# Patient Record
Sex: Male | Born: 1960 | Race: Black or African American | Hispanic: No | Marital: Married | Smoking: Never smoker
Health system: Southern US, Community
[De-identification: ages and names within clinical notes are randomized; demographics above are authoritative.]

## PROBLEM LIST (undated history)

## (undated) ENCOUNTER — Ambulatory Visit (HOSPITAL_COMMUNITY): Payer: No Typology Code available for payment source | Source: Home / Self Care

## (undated) DIAGNOSIS — G8929 Other chronic pain: Secondary | ICD-10-CM

## (undated) DIAGNOSIS — I10 Essential (primary) hypertension: Secondary | ICD-10-CM

## (undated) DIAGNOSIS — J309 Allergic rhinitis, unspecified: Secondary | ICD-10-CM

## (undated) DIAGNOSIS — E7800 Pure hypercholesterolemia, unspecified: Secondary | ICD-10-CM

## (undated) DIAGNOSIS — M545 Other chronic pain: Secondary | ICD-10-CM

## (undated) HISTORY — DX: Allergic rhinitis, unspecified: J30.9

## (undated) HISTORY — DX: Other chronic pain: G89.29

## (undated) HISTORY — DX: Other chronic pain: M54.50

## (undated) HISTORY — DX: Low back pain: M54.5

---

## 1999-12-29 ENCOUNTER — Encounter: Payer: Self-pay | Admitting: Emergency Medicine

## 1999-12-29 ENCOUNTER — Emergency Department (HOSPITAL_COMMUNITY): Admission: EM | Admit: 1999-12-29 | Discharge: 1999-12-29 | Payer: Self-pay | Admitting: Emergency Medicine

## 2000-06-16 ENCOUNTER — Encounter: Payer: Self-pay | Admitting: Occupational Medicine

## 2000-06-16 ENCOUNTER — Encounter: Admission: RE | Admit: 2000-06-16 | Discharge: 2000-06-16 | Payer: Self-pay | Admitting: Occupational Medicine

## 2001-09-03 ENCOUNTER — Ambulatory Visit (HOSPITAL_COMMUNITY): Admission: RE | Admit: 2001-09-03 | Discharge: 2001-09-03 | Payer: Self-pay | Admitting: *Deleted

## 2001-09-03 ENCOUNTER — Encounter: Payer: Self-pay | Admitting: *Deleted

## 2001-10-08 ENCOUNTER — Ambulatory Visit (HOSPITAL_COMMUNITY): Admission: RE | Admit: 2001-10-08 | Discharge: 2001-10-08 | Payer: Self-pay | Admitting: *Deleted

## 2001-10-08 ENCOUNTER — Encounter: Payer: Self-pay | Admitting: *Deleted

## 2002-03-30 ENCOUNTER — Encounter: Payer: Self-pay | Admitting: Emergency Medicine

## 2002-03-30 ENCOUNTER — Emergency Department (HOSPITAL_COMMUNITY): Admission: EM | Admit: 2002-03-30 | Discharge: 2002-03-30 | Payer: Self-pay | Admitting: Emergency Medicine

## 2002-04-07 ENCOUNTER — Emergency Department (HOSPITAL_COMMUNITY): Admission: EM | Admit: 2002-04-07 | Discharge: 2002-04-07 | Payer: Self-pay | Admitting: Emergency Medicine

## 2003-06-17 ENCOUNTER — Emergency Department (HOSPITAL_COMMUNITY): Admission: EM | Admit: 2003-06-17 | Discharge: 2003-06-18 | Payer: Self-pay | Admitting: Emergency Medicine

## 2003-12-22 ENCOUNTER — Emergency Department (HOSPITAL_COMMUNITY): Admission: EM | Admit: 2003-12-22 | Discharge: 2003-12-22 | Payer: Self-pay | Admitting: *Deleted

## 2004-06-11 ENCOUNTER — Ambulatory Visit: Payer: Self-pay | Admitting: Family Medicine

## 2004-12-13 ENCOUNTER — Ambulatory Visit: Payer: Self-pay | Admitting: Family Medicine

## 2005-02-08 ENCOUNTER — Ambulatory Visit: Payer: Self-pay | Admitting: Nurse Practitioner

## 2005-02-09 ENCOUNTER — Ambulatory Visit: Payer: Self-pay | Admitting: *Deleted

## 2005-04-25 ENCOUNTER — Ambulatory Visit: Payer: Self-pay | Admitting: Nurse Practitioner

## 2006-05-26 ENCOUNTER — Other Ambulatory Visit: Admission: RE | Admit: 2006-05-26 | Discharge: 2006-05-26 | Payer: Self-pay | Admitting: Family Medicine

## 2006-05-26 ENCOUNTER — Encounter (INDEPENDENT_AMBULATORY_CARE_PROVIDER_SITE_OTHER): Payer: Self-pay | Admitting: Specialist

## 2006-05-26 ENCOUNTER — Ambulatory Visit: Payer: Self-pay | Admitting: Nurse Practitioner

## 2006-06-20 ENCOUNTER — Ambulatory Visit: Payer: Self-pay | Admitting: Nurse Practitioner

## 2006-06-30 ENCOUNTER — Ambulatory Visit (HOSPITAL_COMMUNITY): Admission: RE | Admit: 2006-06-30 | Discharge: 2006-06-30 | Payer: Self-pay | Admitting: Family Medicine

## 2006-07-14 ENCOUNTER — Ambulatory Visit: Payer: Self-pay | Admitting: Nurse Practitioner

## 2006-08-10 ENCOUNTER — Ambulatory Visit (HOSPITAL_COMMUNITY): Admission: RE | Admit: 2006-08-10 | Discharge: 2006-08-10 | Payer: Self-pay | Admitting: Urology

## 2006-09-15 ENCOUNTER — Ambulatory Visit (HOSPITAL_BASED_OUTPATIENT_CLINIC_OR_DEPARTMENT_OTHER): Admission: RE | Admit: 2006-09-15 | Discharge: 2006-09-15 | Payer: Self-pay | Admitting: Urology

## 2006-09-15 ENCOUNTER — Encounter (INDEPENDENT_AMBULATORY_CARE_PROVIDER_SITE_OTHER): Payer: Self-pay | Admitting: Specialist

## 2006-10-20 ENCOUNTER — Ambulatory Visit: Payer: Self-pay | Admitting: Nurse Practitioner

## 2007-01-31 IMAGING — CT CT ABDOMEN W/O CM
1 series · 16 of 32 positions shown, 20 images · IV contrast (agent unspecified)
Comparison: 09/03/2001.

CLINICAL DATA: Bilateral flank pain and hematuria for one year. History of
nephrolithiasis.

ABDOMEN CT WITHOUT CONTRAST:
TECHNIQUE: Helical transaxial images of the abdomen and pelvis were obtained
without intravenous or oral contrast.

[Series 2: stone_wo 5.0 b40f st · axial · 0.68mm/px · z∈[-481,-121]mm · 16 of 101 slices shown, 20 images]
[im 7/101  soft-tissue]
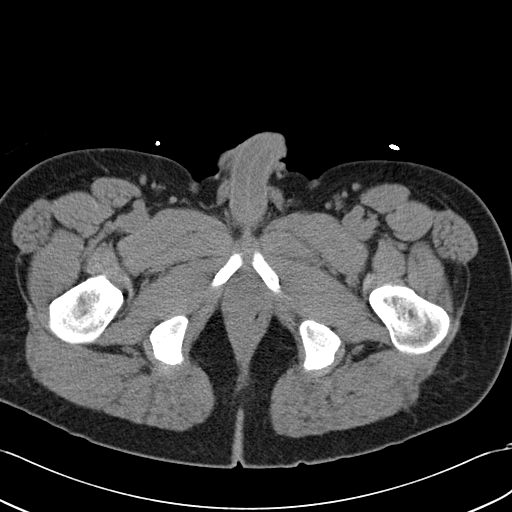
[im 7/101  bone]
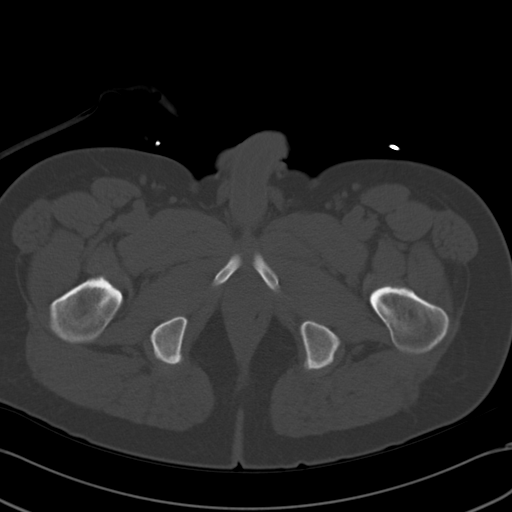
[im 13/101  soft-tissue]
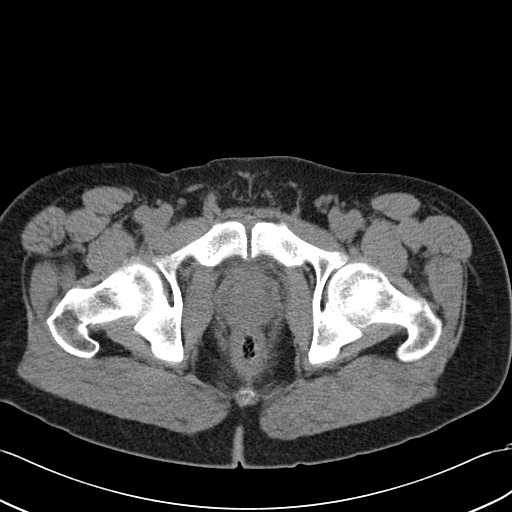
[im 20/101  soft-tissue]
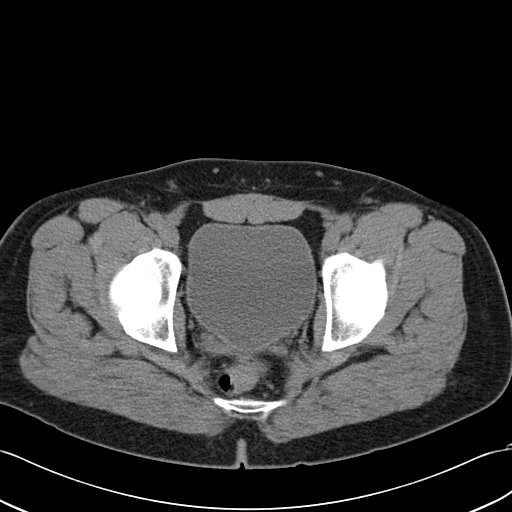
[im 26/101  soft-tissue]
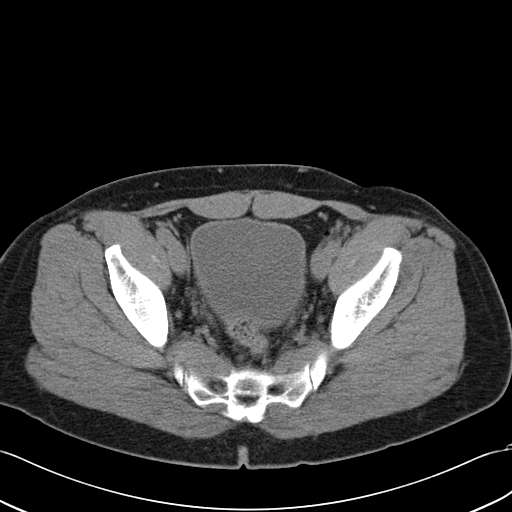
[im 33/101  soft-tissue]
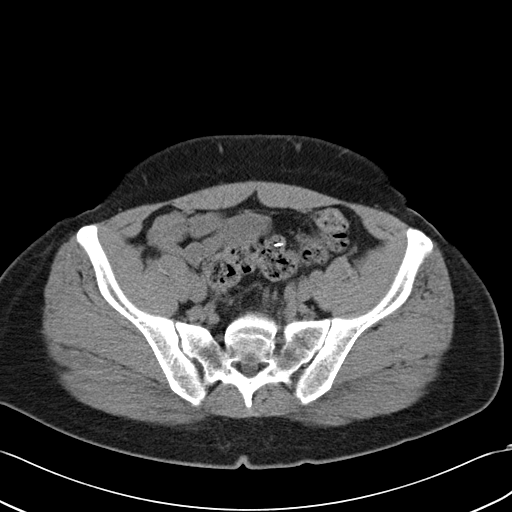
[im 39/101  soft-tissue]
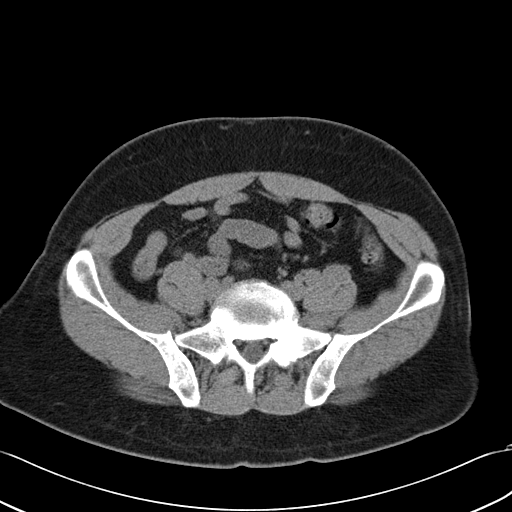
[im 46/101  soft-tissue]
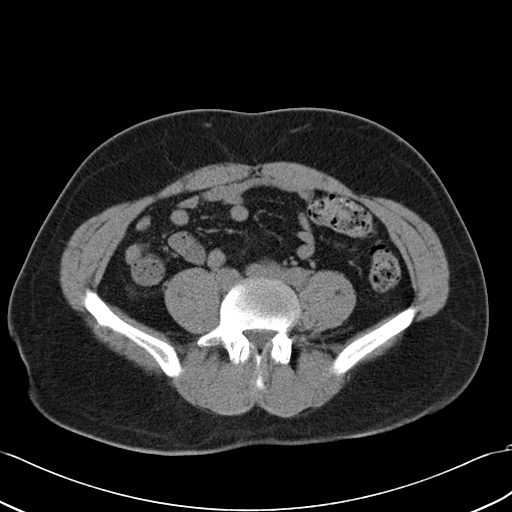
[im 55/101  soft-tissue]
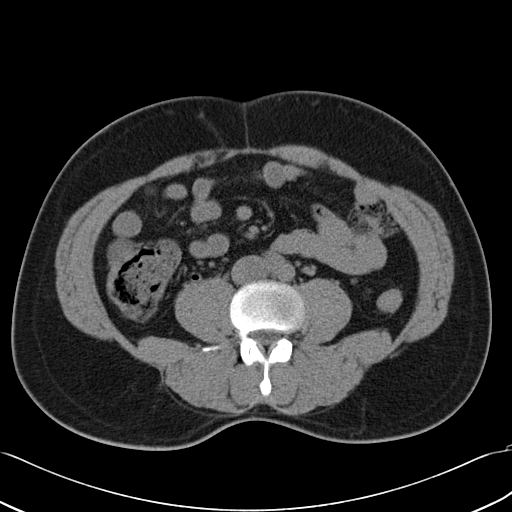
[im 62/101  soft-tissue]
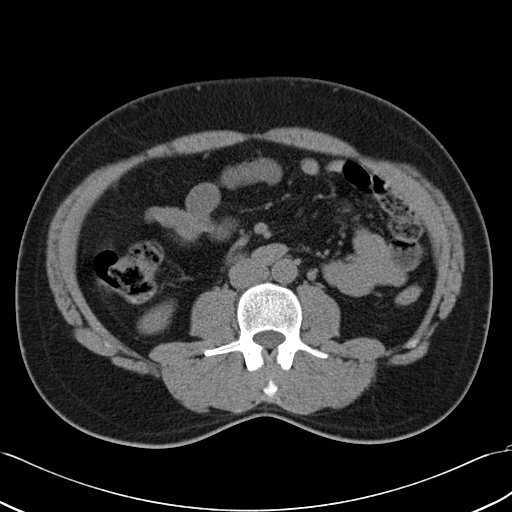
[im 62/101  bone]
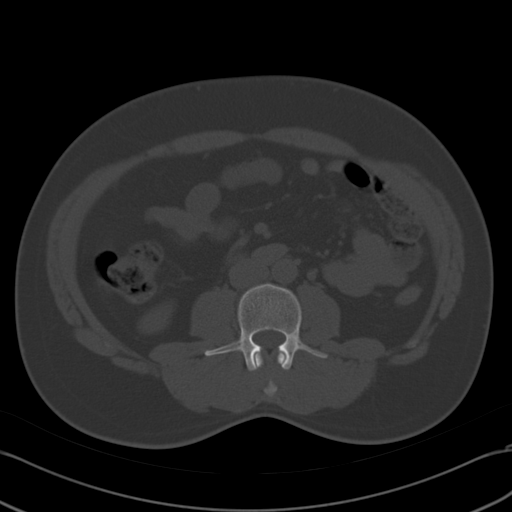
[im 68/101  soft-tissue]
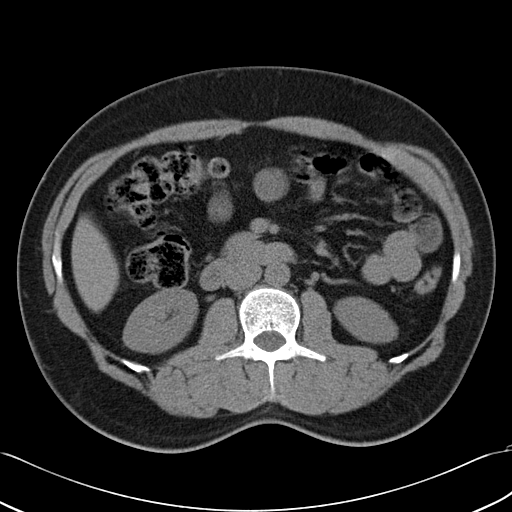
[im 75/101  soft-tissue]
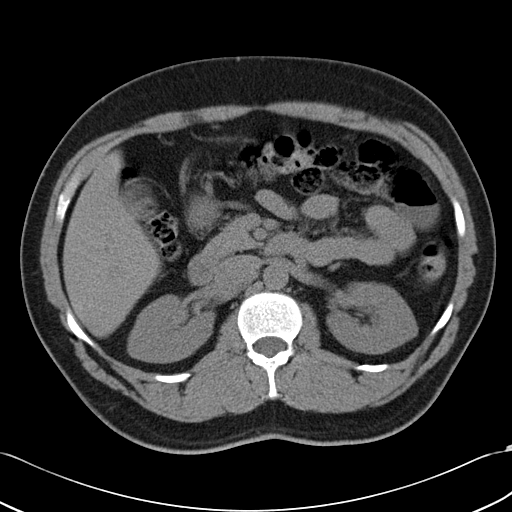
[im 81/101  soft-tissue]
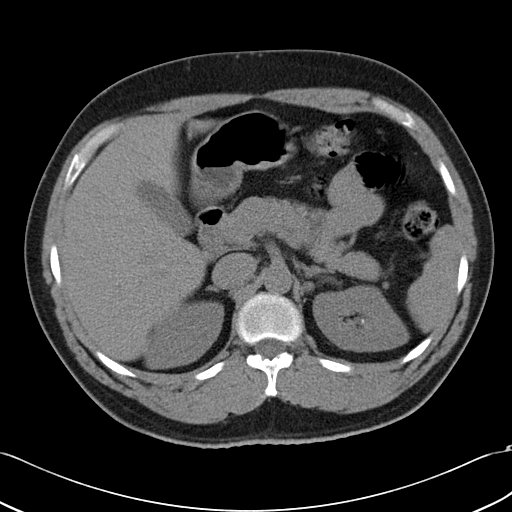
[im 88/101  soft-tissue]
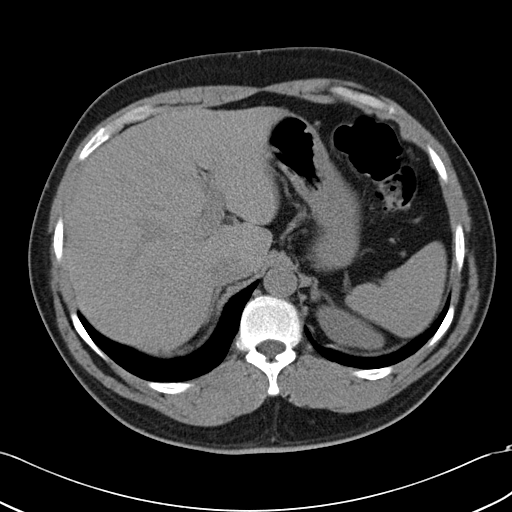
[im 88/101  lung]
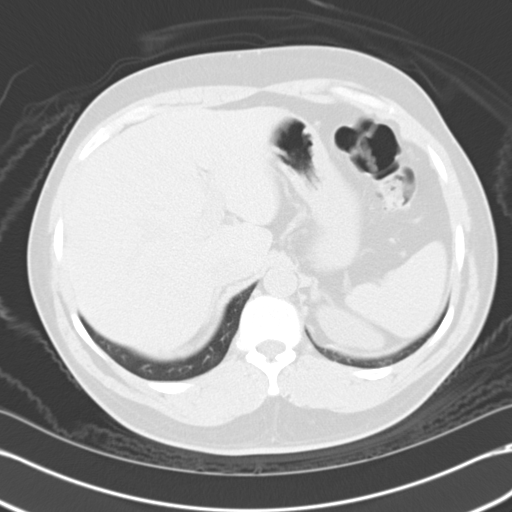
[im 91/101  lung]
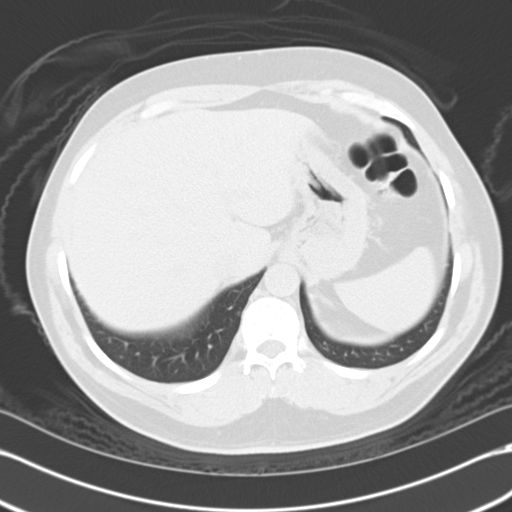
[im 94/101  soft-tissue]
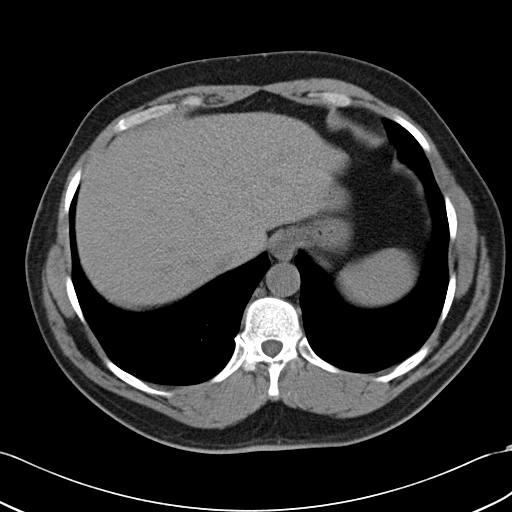
[im 94/101  lung]
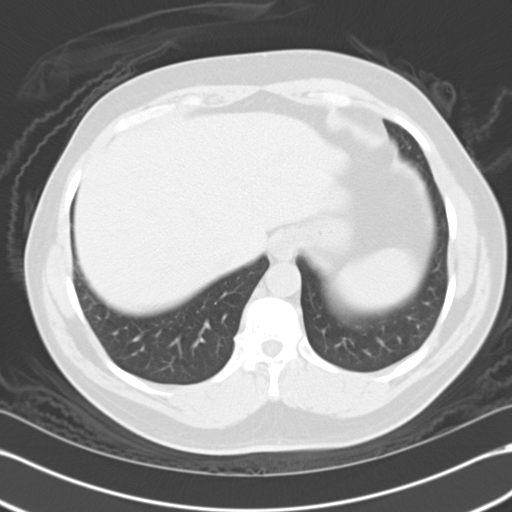
[im 97/101  lung]
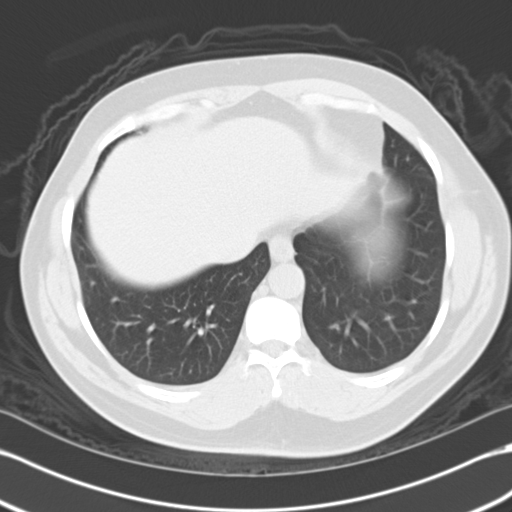

[16 of 32 positions shown; findings below may reference images not displayed]

FINDINGS: Stable 3 mm calculus in the upper pole of the left kidney. The
previously demonstrated small lower pole left renal calculus and tiny upper pole
right renal calculus are no longer seen. No ureteral calculi and no
hydronephrosis. The remainder of the examination is unremarkable.
IMPRESSION: 3 mm nonobstructing left renal calculus.

PELVIS CT WITHOUT CONTRAST:
FINDINGS: Small bilateral pelvic phleboliths. No bladder or ureteral calculi
seen. Small linear calcific density in the stool in the sigmoid colon,
suggesting a small, ingested piece of bone.
IMPRESSION: No significant pelvic abnormality.

## 2007-03-28 ENCOUNTER — Encounter (INDEPENDENT_AMBULATORY_CARE_PROVIDER_SITE_OTHER): Payer: Self-pay | Admitting: *Deleted

## 2007-07-08 ENCOUNTER — Emergency Department (HOSPITAL_COMMUNITY): Admission: EM | Admit: 2007-07-08 | Discharge: 2007-07-08 | Payer: Self-pay | Admitting: Emergency Medicine

## 2009-01-22 ENCOUNTER — Emergency Department (HOSPITAL_COMMUNITY): Admission: EM | Admit: 2009-01-22 | Discharge: 2009-01-22 | Payer: Self-pay | Admitting: Emergency Medicine

## 2009-10-09 ENCOUNTER — Ambulatory Visit (HOSPITAL_BASED_OUTPATIENT_CLINIC_OR_DEPARTMENT_OTHER): Admission: RE | Admit: 2009-10-09 | Discharge: 2009-10-09 | Payer: Self-pay | Admitting: Internal Medicine

## 2009-10-17 ENCOUNTER — Ambulatory Visit: Payer: Self-pay | Admitting: Internal Medicine

## 2010-08-01 ENCOUNTER — Encounter: Payer: Self-pay | Admitting: Family Medicine

## 2010-08-01 ENCOUNTER — Encounter: Payer: Self-pay | Admitting: Neurology

## 2010-10-17 LAB — URINALYSIS, ROUTINE W REFLEX MICROSCOPIC
Bilirubin Urine: NEGATIVE
Ketones, ur: NEGATIVE mg/dL
Nitrite: NEGATIVE
Protein, ur: 30 mg/dL — AB
Specific Gravity, Urine: 1.029 (ref 1.005–1.030)

## 2010-10-17 LAB — URINE MICROSCOPIC-ADD ON

## 2010-11-26 NOTE — Op Note (Signed)
NAME:  Howard Rice, Howard Rice                  ACCOUNT NO.:  [id number]   MEDICAL RECORD NO.:  [id number]          PATIENT TYPE:  AMB   LOCATION:  [facility]                         FACILITY:  [facility]   PHYSICIAN:  Ronald L. Earlene Plater, M.D.  DATE OF BIRTH:  Jul 17, 1960   DATE OF PROCEDURE:  09/15/2006  DATE OF DISCHARGE:                               OPERATIVE REPORT   PREOPERATIVE DIAGNOSIS:  Hematuria, bilateral flank pain, and positive  NMP22.   PROCEDURE:  Cystourethroscopy, bladder and bilateral renal pelvic  washings, and bilateral retrograde pyelogram.   SURGEON:  Dr. Gaynelle Arabian.   ANESTHESIA:  LMA   ESTIMATED BLOOD LOSS:  Negligible.   TUBES:  None.   COMPLICATIONS:  None.   INDICATIONS FOR PROCEDURE:  [name] is a very nice 50 year old  [place] male who presents initially with left flank pain but then  developed subsequent right flank pain and hematuria. He underwent CT  scan which revealed stable punctate stone within the upper pole  collecting system left kidney and a small lung lesion that will be  followed up in the future.  He saw really no other abnormalities.  However, he was found to have a positive NMP22. After understanding  risks, benefits and alternatives elected to proceed with the above  procedure.   DESCRIPTION OF PROCEDURE:  The patient was placed supine position after  proper LMA anesthesia was placed.  The dorsal lithotomy position,  prepped and draped with Betadine in sterile fashion.  Cystourethroscopy  was performed with 22.5 French Olympus panendoscope and was noted to  have mild trilobar hypertrophy.  The bladder was smooth-walled.  Efflux  of clear urine was noted from normally placed ureteral orifices  bilaterally.  Bilateral retrograde pyelograms performed with 6-French  open-ended catheter and both systems appeared to be totally normal and  drained normally.  There were no filling defects. The open-ended  catheter was then placed in the right and left  renal pelvis and  barbotage cytologies were obtained from each renal pelvis and submitted  for cytology.  Barbotage cytologies were then obtained of the bladder  with saline and submitted for cytology.  The bladder drained.  The  panendoscope was removed.  The patient was taken to the recovery room  stable.   RETROGRADE URETERAL PYELOGRAM:  Utilizing 6-French open-ended catheter  bilateral retrograde ureteral pyelograms were performed. The entire  ureter and collecting system appeared to be normal.  There was no  hydronephrosis or filling defects.  The system appeared to drain well.      Ronald L. Earlene Plater, M.D.  Electronically Signed     RLD/MEDQ  D:  09/15/2006  T:  09/15/2006  Job:  [id number]

## 2011-04-15 LAB — URINALYSIS, ROUTINE W REFLEX MICROSCOPIC
Bilirubin Urine: NEGATIVE
Glucose, UA: NEGATIVE
Leukocytes, UA: NEGATIVE
Protein, ur: NEGATIVE
Specific Gravity, Urine: 1.019

## 2011-04-15 LAB — URINE MICROSCOPIC-ADD ON

## 2011-06-13 ENCOUNTER — Other Ambulatory Visit (HOSPITAL_COMMUNITY): Payer: Self-pay | Admitting: Family Medicine

## 2011-06-13 ENCOUNTER — Ambulatory Visit (HOSPITAL_COMMUNITY)
Admission: RE | Admit: 2011-06-13 | Discharge: 2011-06-13 | Disposition: A | Discharge status: Home / Self Care | Payer: Medicaid Other | Source: Ambulatory Visit | Attending: Family Medicine | Admitting: Family Medicine

## 2011-06-13 DIAGNOSIS — M5030 Other cervical disc degeneration, unspecified cervical region: Secondary | ICD-10-CM | POA: Insufficient documentation

## 2011-06-13 DIAGNOSIS — R209 Unspecified disturbances of skin sensation: Secondary | ICD-10-CM | POA: Insufficient documentation

## 2011-06-13 DIAGNOSIS — M542 Cervicalgia: Secondary | ICD-10-CM | POA: Insufficient documentation

## 2011-06-13 DIAGNOSIS — R52 Pain, unspecified: Secondary | ICD-10-CM

## 2011-06-13 DIAGNOSIS — M5380 Other specified dorsopathies, site unspecified: Secondary | ICD-10-CM | POA: Insufficient documentation

## 2011-06-18 ENCOUNTER — Encounter: Payer: Self-pay | Admitting: Emergency Medicine

## 2011-06-18 ENCOUNTER — Emergency Department (HOSPITAL_COMMUNITY)
Admission: EM | Admit: 2011-06-18 | Discharge: 2011-06-18 | Disposition: A | Discharge status: Home / Self Care | Payer: Medicaid Other | Attending: Emergency Medicine | Admitting: Emergency Medicine

## 2011-06-18 DIAGNOSIS — J988 Other specified respiratory disorders: Secondary | ICD-10-CM

## 2011-06-18 DIAGNOSIS — I10 Essential (primary) hypertension: Secondary | ICD-10-CM | POA: Insufficient documentation

## 2011-06-18 DIAGNOSIS — R059 Cough, unspecified: Secondary | ICD-10-CM | POA: Insufficient documentation

## 2011-06-18 DIAGNOSIS — B9789 Other viral agents as the cause of diseases classified elsewhere: Secondary | ICD-10-CM | POA: Insufficient documentation

## 2011-06-18 DIAGNOSIS — R079 Chest pain, unspecified: Secondary | ICD-10-CM | POA: Insufficient documentation

## 2011-06-18 DIAGNOSIS — IMO0001 Reserved for inherently not codable concepts without codable children: Secondary | ICD-10-CM | POA: Insufficient documentation

## 2011-06-18 DIAGNOSIS — R300 Dysuria: Secondary | ICD-10-CM | POA: Insufficient documentation

## 2011-06-18 DIAGNOSIS — M549 Dorsalgia, unspecified: Secondary | ICD-10-CM | POA: Insufficient documentation

## 2011-06-18 DIAGNOSIS — R05 Cough: Secondary | ICD-10-CM | POA: Insufficient documentation

## 2011-06-18 DIAGNOSIS — Z79899 Other long term (current) drug therapy: Secondary | ICD-10-CM | POA: Insufficient documentation

## 2011-06-18 DIAGNOSIS — H5710 Ocular pain, unspecified eye: Secondary | ICD-10-CM | POA: Insufficient documentation

## 2011-06-18 DIAGNOSIS — E7800 Pure hypercholesterolemia, unspecified: Secondary | ICD-10-CM | POA: Insufficient documentation

## 2011-06-18 DIAGNOSIS — R070 Pain in throat: Secondary | ICD-10-CM | POA: Insufficient documentation

## 2011-06-18 HISTORY — DX: Essential (primary) hypertension: I10

## 2011-06-18 HISTORY — DX: Pure hypercholesterolemia, unspecified: E78.00

## 2011-06-18 LAB — URINALYSIS, ROUTINE W REFLEX MICROSCOPIC
Bilirubin Urine: NEGATIVE
Glucose, UA: NEGATIVE mg/dL
Ketones, ur: NEGATIVE mg/dL
Leukocytes, UA: NEGATIVE
Nitrite: NEGATIVE
Protein, ur: 30 mg/dL — AB
Urobilinogen, UA: 0.2 mg/dL (ref 0.0–1.0)
pH: 7 (ref 5.0–8.0)

## 2011-06-18 LAB — URINE MICROSCOPIC-ADD ON

## 2011-06-18 MED ORDER — ALBUTEROL SULFATE HFA 108 (90 BASE) MCG/ACT IN AERS
2.0000 | INHALATION_SPRAY | Freq: Four times a day (QID) | RESPIRATORY_TRACT | Status: DC
  Administered 2011-06-18: 2 via RESPIRATORY_TRACT
  Filled 2011-06-18: qty 6.7

## 2011-06-18 MED ORDER — HYDROCOD POLST-CHLORPHEN POLST 10-8 MG/5ML PO LQCR: 5.0000 mL | Freq: Two times a day (BID) | ORAL | Status: DC | PRN

## 2011-06-18 MED ORDER — KETOROLAC TROMETHAMINE 30 MG/ML IJ SOLN
30.0000 mg | Freq: Once | INTRAMUSCULAR | Status: DC
  Filled 2011-06-18: qty 1

## 2011-06-18 MED ORDER — KETOROLAC TROMETHAMINE 30 MG/ML IJ SOLN
60.0000 mg | Freq: Once | INTRAMUSCULAR | Status: AC
  Administered 2011-06-18: 60 mg via INTRAMUSCULAR
  Filled 2011-06-18: qty 2

## 2011-06-18 MED ORDER — ACETAMINOPHEN 325 MG PO TABS
650.0000 mg | ORAL_TABLET | Freq: Once | ORAL | Status: AC
  Administered 2011-06-18: 650 mg via ORAL
  Filled 2011-06-18: qty 2

## 2011-06-18 NOTE — ED Provider Notes (Signed)
History     CSN: [id number] Arrival date & time: 06/18/2011  4:07 PM   First MD Initiated Contact with Patient 06/18/11 1842      Chief Complaint  Patient presents with  . Back Pain    (Consider location/radiation/quality/duration/timing/severity/associated sxs/prior treatment) HPI Comments: Patient reports body aches, nonproductive cough, burning in his chest, pain in his throat, burning in his eyes, aching all over his body, and pain when he urinates.  States the symptoms began last night.  He has two grandchildren in the house with similar symptoms.  Did not take any medication at home because he takes chronic pain medications at home and was afraid to add anything.    The history is provided by the patient.    Past Medical History  Diagnosis Date  . Hypertension   . Hypercholesteremia     History reviewed. No pertinent past surgical history.  History reviewed. No pertinent family history.  History  Substance Use Topics  . Smoking status: Never Smoker   . Smokeless tobacco: Not on file  . Alcohol Use: No      Review of Systems  All other systems reviewed and are negative.    Allergies  Review of patient's allergies indicates no known allergies.  Home Medications   Current Outpatient Rx  Name Route Sig Dispense Refill  . CYCLOBENZAPRINE HCL 10 MG PO TABS Oral Take 10 mg by mouth 3 (three) times daily as needed. For muscle spasms.     Marland Kitchen LORATADINE 10 MG PO TABS Oral Take 10 mg by mouth daily.      Marland Kitchen MONTELUKAST SODIUM 10 MG PO TABS Oral Take 10 mg by mouth at bedtime.      Marland Kitchen NAPROXEN 500 MG PO TABS Oral Take 500 mg by mouth 2 (two) times daily with a meal.      . TRAMADOL HCL 50 MG PO TABS Oral Take 50 mg by mouth 2 (two) times daily as needed. For pain. Maximum dose= 8 tablets per day     . TRIAMCINOLONE ACETONIDE 55 MCG/ACT NA INHA Nasal Place 2 sprays into the nose daily.      Marland Kitchen HYDROCOD POLST-CHLORPHEN POLST 10-8 MG/5ML PO LQCR Oral Take 5 mLs by mouth  every 12 (twelve) hours as needed. 80 mL 0    BP 128/93  Pulse 112  Temp(Src) 99.1 F (37.3 C) (Oral)  Resp 22  SpO2 97%  Physical Exam  Nursing note and vitals reviewed. Constitutional: He is oriented to person, place, and time. He appears well-developed and well-nourished.  HENT:  Head: Normocephalic and atraumatic.  Mouth/Throat: No oropharyngeal exudate.  Neck: Neck supple.  Cardiovascular: Normal rate, regular rhythm and normal heart sounds.   Pulmonary/Chest: Effort normal and breath sounds normal. No respiratory distress. He has no wheezes. He has no rales. He exhibits no tenderness.  Abdominal: Soft. Bowel sounds are normal. He exhibits no distension and no mass. There is no tenderness. There is no rebound and no guarding.  Neurological: He is alert and oriented to person, place, and time.    ED Course  Procedures (including critical care time)  Labs Reviewed  URINALYSIS, ROUTINE W REFLEX MICROSCOPIC - Abnormal; Notable for the following:    Hgb urine dipstick SMALL (*)    Protein, ur 30 (*)    All other components within normal limits  URINE MICROSCOPIC-ADD ON   No results found.  Patient feeling much better after toradol, tylenol, and albuterol.     1. Viral respiratory  illness       MDM  Patient with fever, body aches, cough, sick contacts with similar symptoms.  Patient is not immunocompromised and has no chronic problems with exception of chronic back pain.  Symptoms treated in ED with improvement.          Howard Rice, [place] 06/18/11 2130

## 2011-06-18 NOTE — ED Notes (Signed)
Pt reports generalized body aches and coughing x 2 days.

## 2011-06-18 NOTE — ED Notes (Signed)
Pt NAD, resp e/u, AOx4, pt states understanding of discharge instructions and denies questions at time of discharge.  

## 2011-06-19 NOTE — ED Provider Notes (Signed)
Medical screening examination/treatment/procedure(s) were performed by non-physician practitioner and as supervising physician I was immediately available for consultation/collaboration.   Ciena Sampley E Itsel Opfer, MD 06/19/11 1036 

## 2012-06-19 ENCOUNTER — Emergency Department (HOSPITAL_COMMUNITY): Payer: Medicaid Other

## 2012-06-19 ENCOUNTER — Emergency Department (HOSPITAL_COMMUNITY)
Admission: EM | Admit: 2012-06-19 | Discharge: 2012-06-20 | Disposition: A | Discharge status: Home / Self Care | Payer: Medicaid Other | Attending: Emergency Medicine | Admitting: Emergency Medicine

## 2012-06-19 ENCOUNTER — Encounter (HOSPITAL_COMMUNITY): Payer: Self-pay | Admitting: Emergency Medicine

## 2012-06-19 DIAGNOSIS — Z79899 Other long term (current) drug therapy: Secondary | ICD-10-CM | POA: Insufficient documentation

## 2012-06-19 DIAGNOSIS — S0993XA Unspecified injury of face, initial encounter: Secondary | ICD-10-CM | POA: Insufficient documentation

## 2012-06-19 DIAGNOSIS — IMO0002 Reserved for concepts with insufficient information to code with codable children: Secondary | ICD-10-CM | POA: Insufficient documentation

## 2012-06-19 DIAGNOSIS — Y9389 Activity, other specified: Secondary | ICD-10-CM | POA: Insufficient documentation

## 2012-06-19 DIAGNOSIS — S60512A Abrasion of left hand, initial encounter: Secondary | ICD-10-CM

## 2012-06-19 DIAGNOSIS — Y9289 Other specified places as the place of occurrence of the external cause: Secondary | ICD-10-CM | POA: Insufficient documentation

## 2012-06-19 DIAGNOSIS — E7800 Pure hypercholesterolemia, unspecified: Secondary | ICD-10-CM | POA: Insufficient documentation

## 2012-06-19 DIAGNOSIS — S199XXA Unspecified injury of neck, initial encounter: Secondary | ICD-10-CM | POA: Insufficient documentation

## 2012-06-19 DIAGNOSIS — T07XXXA Unspecified multiple injuries, initial encounter: Secondary | ICD-10-CM | POA: Insufficient documentation

## 2012-06-19 DIAGNOSIS — S0081XA Abrasion of other part of head, initial encounter: Secondary | ICD-10-CM

## 2012-06-19 DIAGNOSIS — I10 Essential (primary) hypertension: Secondary | ICD-10-CM | POA: Insufficient documentation

## 2012-06-19 LAB — CBC WITH DIFFERENTIAL/PLATELET
Basophils Relative: 0 % (ref 0–1)
HCT: 45 % (ref 39.0–52.0)
Hemoglobin: 16.1 g/dL (ref 13.0–17.0)
Lymphs Abs: 3.1 10*3/uL (ref 0.7–4.0)
MCHC: 35.8 g/dL (ref 30.0–36.0)
Monocytes Absolute: 0.6 10*3/uL (ref 0.1–1.0)
Monocytes Relative: 6 % (ref 3–12)
Neutro Abs: 5.4 10*3/uL (ref 1.7–7.7)
RBC: 5.21 MIL/uL (ref 4.22–5.81)

## 2012-06-19 LAB — COMPREHENSIVE METABOLIC PANEL
Albumin: 4 g/dL (ref 3.5–5.2)
Alkaline Phosphatase: 80 U/L (ref 39–117)
BUN: 21 mg/dL (ref 6–23)
CO2: 27 mEq/L (ref 19–32)
Chloride: 101 mEq/L (ref 96–112)
Creatinine, Ser: 1.19 mg/dL (ref 0.50–1.35)
GFR calc Af Amer: 79 mL/min — ABNORMAL LOW (ref >90)
GFR calc non Af Amer: 68 mL/min — ABNORMAL LOW (ref >90)
Glucose, Bld: 101 mg/dL — ABNORMAL HIGH (ref 70–99)
Total Bilirubin: 0.5 mg/dL (ref 0.3–1.2)

## 2012-06-19 LAB — LIPASE, BLOOD: Lipase: 24 U/L (ref 11–59)

## 2012-06-19 MED ORDER — SODIUM CHLORIDE 0.9 % IV SOLN
INTRAVENOUS | Status: DC
  Administered 2012-06-19: 23:00:00 via INTRAVENOUS

## 2012-06-19 MED ORDER — HYDROMORPHONE HCL PF 1 MG/ML IJ SOLN
1.0000 mg | Freq: Once | INTRAMUSCULAR | Status: AC
  Administered 2012-06-19: 1 mg via INTRAVENOUS
  Filled 2012-06-19: qty 1

## 2012-06-19 MED ORDER — SODIUM CHLORIDE 0.9 % IV BOLUS (SEPSIS)
1000.0000 mL | Freq: Once | INTRAVENOUS | Status: AC
  Administered 2012-06-19: 1000 mL via INTRAVENOUS

## 2012-06-19 MED ORDER — ONDANSETRON HCL 4 MG/2ML IJ SOLN
4.0000 mg | Freq: Once | INTRAMUSCULAR | Status: AC
  Administered 2012-06-19: 4 mg via INTRAVENOUS
  Filled 2012-06-19: qty 2
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
## 2012-06-19 NOTE — ED Notes (Addendum)
Back tender to palpation
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
## 2012-06-20 ENCOUNTER — Encounter (HOSPITAL_COMMUNITY): Payer: Self-pay | Admitting: Radiology

## 2012-06-20 ENCOUNTER — Emergency Department (HOSPITAL_COMMUNITY): Payer: Medicaid Other

## 2012-06-20 MED ORDER — OXYCODONE-ACETAMINOPHEN 5-325 MG PO TABS: 1.0000 | ORAL_TABLET | ORAL | Status: DC | PRN

## 2012-06-20 MED ORDER — IOHEXOL 300 MG/ML  SOLN
100.0000 mL | Freq: Once | INTRAMUSCULAR | Status: AC | PRN
  Administered 2012-06-20: 100 mL via INTRAVENOUS

## 2012-06-20 MED ORDER — HYDROMORPHONE HCL PF 1 MG/ML IJ SOLN
1.0000 mg | Freq: Once | INTRAMUSCULAR | Status: AC
  Administered 2012-06-20: 1 mg via INTRAVENOUS
  Filled 2012-06-20: qty 1

## 2012-06-20 NOTE — ED Notes (Signed)
Pt. Reports not opposed to blood transfusion.

## 2012-06-20 NOTE — ED Notes (Signed)
Dr. Wentz at bedside. 

## 2014-09-23 ENCOUNTER — Ambulatory Visit: Payer: Self-pay | Admitting: Podiatry

## 2015-06-26 ENCOUNTER — Other Ambulatory Visit: Payer: Self-pay | Admitting: Surgery

## 2015-06-26 ENCOUNTER — Ambulatory Visit: Payer: Self-pay | Admitting: Surgery

## 2015-06-26 DIAGNOSIS — E041 Nontoxic single thyroid nodule: Secondary | ICD-10-CM

## 2015-07-28 ENCOUNTER — Other Ambulatory Visit: Payer: Self-pay | Admitting: Surgery

## 2015-07-28 ENCOUNTER — Inpatient Hospital Stay
Admission: RE | Admit: 2015-07-28 | Discharge: 2015-07-28 | Disposition: A | Discharge status: Home / Self Care | Payer: Self-pay | Source: Ambulatory Visit | Attending: Surgery | Admitting: Surgery

## 2015-07-28 DIAGNOSIS — E041 Nontoxic single thyroid nodule: Secondary | ICD-10-CM

## 2015-07-29 ENCOUNTER — Other Ambulatory Visit: Payer: Self-pay | Admitting: Surgery

## 2015-07-29 DIAGNOSIS — E041 Nontoxic single thyroid nodule: Secondary | ICD-10-CM

## 2015-07-30 ENCOUNTER — Other Ambulatory Visit (HOSPITAL_COMMUNITY)
Admission: RE | Admit: 2015-07-30 | Discharge: 2015-07-30 | Disposition: A | Discharge status: Home / Self Care | Payer: Medicaid Other | Source: Ambulatory Visit | Attending: Radiology | Admitting: Radiology

## 2015-07-30 ENCOUNTER — Ambulatory Visit
Admission: RE | Admit: 2015-07-30 | Discharge: 2015-07-30 | Disposition: A | Discharge status: Home / Self Care | Payer: Medicaid Other | Source: Ambulatory Visit | Attending: Surgery | Admitting: Surgery

## 2015-07-30 DIAGNOSIS — E041 Nontoxic single thyroid nodule: Secondary | ICD-10-CM

## 2017-11-01 MED FILL — HYDROCORTISONE ACETATE 25 M: 25 | 14 days supply | Qty: 28 | Fill #0

## 2017-11-01 MED FILL — AMLODIPINE BESYLATE 5 MG TA: 5 | 30 days supply | Qty: 30 | Fill #0

## 2017-11-02 ENCOUNTER — Emergency Department (HOSPITAL_COMMUNITY): Payer: Self-pay

## 2017-11-02 ENCOUNTER — Encounter (HOSPITAL_COMMUNITY): Payer: Self-pay

## 2017-11-02 ENCOUNTER — Emergency Department (HOSPITAL_COMMUNITY)
Admission: EM | Admit: 2017-11-02 | Discharge: 2017-11-02 | Disposition: A | Discharge status: Home / Self Care | Payer: Self-pay | Attending: Emergency Medicine | Admitting: Emergency Medicine

## 2017-11-02 DIAGNOSIS — I159 Secondary hypertension, unspecified: Secondary | ICD-10-CM

## 2017-11-02 DIAGNOSIS — Z79899 Other long term (current) drug therapy: Secondary | ICD-10-CM | POA: Insufficient documentation

## 2017-11-02 DIAGNOSIS — R51 Headache: Secondary | ICD-10-CM | POA: Insufficient documentation

## 2017-11-02 DIAGNOSIS — E7800 Pure hypercholesterolemia, unspecified: Secondary | ICD-10-CM | POA: Insufficient documentation

## 2017-11-02 DIAGNOSIS — I10 Essential (primary) hypertension: Secondary | ICD-10-CM | POA: Insufficient documentation

## 2017-11-02 DIAGNOSIS — R519 Headache, unspecified: Secondary | ICD-10-CM

## 2017-11-02 DIAGNOSIS — R0789 Other chest pain: Secondary | ICD-10-CM | POA: Insufficient documentation

## 2017-11-02 LAB — BASIC METABOLIC PANEL
Anion gap: 11 (ref 5–15)
BUN: 15 mg/dL (ref 6–20)
CALCIUM: 9.4 mg/dL (ref 8.9–10.3)
CHLORIDE: 103 mmol/L (ref 101–111)
CO2: 25 mmol/L (ref 22–32)
CREATININE: 0.95 mg/dL (ref 0.61–1.24)
GFR calc Af Amer: >60 mL/min (ref >60)
GFR calc non Af Amer: >60 mL/min (ref >60)
Glucose, Bld: 83 mg/dL (ref 65–99)
Potassium: 4 mmol/L (ref 3.5–5.1)
Sodium: 139 mmol/L (ref 135–145)

## 2017-11-02 LAB — CBC
HCT: 46.9 % (ref 39.0–52.0)
Hemoglobin: 16.1 g/dL (ref 13.0–17.0)
MCH: 30 pg (ref 26.0–34.0)
MCHC: 34.3 g/dL (ref 30.0–36.0)
MCV: 87.5 fL (ref 78.0–100.0)
PLATELETS: 196 10*3/uL (ref 150–400)
RBC: 5.36 MIL/uL (ref 4.22–5.81)
RDW: 13.3 % (ref 11.5–15.5)
WBC: 9 10*3/uL (ref 4.0–10.5)

## 2017-11-02 LAB — SEDIMENTATION RATE: Sed Rate: 8 mm/hr (ref 0–16)

## 2017-11-02 LAB — C-REACTIVE PROTEIN: CRP: <0.8 mg/dL (ref <1.0)

## 2017-11-02 LAB — BRAIN NATRIURETIC PEPTIDE: B NATRIURETIC PEPTIDE 5: 20.2 pg/mL (ref 0.0–100.0)

## 2017-11-02 LAB — I-STAT TROPONIN, ED: Troponin i, poc: 0 ng/mL (ref 0.00–0.08)

## 2017-11-02 MED ORDER — KETOROLAC TROMETHAMINE 30 MG/ML IJ SOLN
30.0000 mg | Freq: Once | INTRAMUSCULAR | Status: AC
  Administered 2017-11-02: 30 mg via INTRAVENOUS
  Filled 2017-11-02: qty 1

## 2017-11-02 MED ORDER — AMLODIPINE BESYLATE 5 MG PO TABS: 5.0000 mg | ORAL_TABLET | Freq: Every day | ORAL | 0 refills | Status: DC

## 2017-11-02 MED ORDER — AMLODIPINE BESYLATE 5 MG PO TABS
5.0000 mg | ORAL_TABLET | Freq: Once | ORAL | Status: AC
  Administered 2017-11-02: 5 mg via ORAL
  Filled 2017-11-02: qty 1

## 2017-11-02 MED ORDER — DIPHENHYDRAMINE HCL 50 MG/ML IJ SOLN
25.0000 mg | Freq: Once | INTRAMUSCULAR | Status: AC
  Administered 2017-11-02: 25 mg via INTRAVENOUS
  Filled 2017-11-02: qty 1

## 2017-11-02 MED ORDER — LACTATED RINGERS IV BOLUS
1000.0000 mL | Freq: Once | INTRAVENOUS | Status: AC
  Administered 2017-11-02: 1000 mL via INTRAVENOUS

## 2017-11-02 MED ORDER — ACETAMINOPHEN 325 MG PO TABS
650.0000 mg | ORAL_TABLET | Freq: Once | ORAL | Status: AC | PRN
  Administered 2017-11-02: 650 mg via ORAL
  Filled 2017-11-02: qty 2

## 2017-11-02 MED ORDER — METOCLOPRAMIDE HCL 5 MG/ML IJ SOLN
10.0000 mg | Freq: Once | INTRAMUSCULAR | Status: AC
  Administered 2017-11-02: 10 mg via INTRAVENOUS
  Filled 2017-11-02: qty 2

## 2017-11-02 MED ORDER — DEXAMETHASONE SODIUM PHOSPHATE 10 MG/ML IJ SOLN
10.0000 mg | Freq: Once | INTRAMUSCULAR | Status: AC
  Administered 2017-11-02: 10 mg via INTRAVENOUS
  Filled 2017-11-02: qty 1

## 2017-11-02 NOTE — ED Triage Notes (Signed)
Pt presents for evaluation of hypertension, chest pain and epistaxis. States Chest pain started a week ago. Pt reports he has throbbing to temporal areas from headache.
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
## 2017-11-02 NOTE — ED Notes (Signed)
Patient transported to CT 

## 2017-11-02 NOTE — ED Notes (Signed)
Patient transported to X-ray 

## 2017-11-02 NOTE — ED Notes (Signed)
ED Provider at bedside. 

## 2017-11-02 NOTE — ED Provider Notes (Signed)
[facility] Provider Note   CSN: [id number] Arrival date & time: 11/02/17  1126     History   Chief Complaint Chief Complaint  Patient presents with  . Chest Pain  . Epistaxis    HPI Howard Rice is a 57 y.o. male.   Chest Pain   Associated symptoms include headaches.  Epistaxis    Headache   This is a new problem. The current episode started more than 1 week ago. The problem occurs constantly. The problem has been gradually worsening. Associated with: difficulty sleeping, elevated bp. The pain is located in the left unilateral and right unilateral region.    Past Medical History:  Diagnosis Date  . Hypercholesteremia   . Hypertension     There are no active problems to display for this patient.   History reviewed. No pertinent surgical history.      Home Medications    Prior to Admission medications   Medication Sig Start Date End Date Taking? Authorizing Provider  amLODipine (NORVASC) 5 MG tablet Take 1 tablet (5 mg total) by mouth daily. 11/02/17   Kojo Liby, Barbara CowerJason, MD  chlorpheniramine-HYDROcodone (TUSSIONEX PENNKINETIC ER) 10-8 MG/5ML LQCR Take 5 mLs by mouth every 12 (twelve) hours as needed. 06/18/11   Trixie DredgeWest, Emily, PA-C  cyclobenzaprine (FLEXERIL) 10 MG tablet Take 10 mg by mouth 3 (three) times daily as needed. For muscle spasms.     [provider]  loratadine (CLARITIN) 10 MG tablet Take 10 mg by mouth daily.      [provider]  montelukast (SINGULAIR) 10 MG tablet Take 10 mg by mouth at bedtime.      [provider]  naproxen (NAPROSYN) 500 MG tablet Take 500 mg by mouth 2 (two) times daily with a meal.      [provider]  oxyCODONE-acetaminophen (PERCOCET/ROXICET) 5-325 MG per tablet Take 1 tablet by mouth every 4 (four) hours as needed for pain. 06/20/12   Mancel BaleWentz, Elliott, MD  triamcinolone (NASACORT) 55 MCG/ACT nasal inhaler Place 2 sprays into the nose daily.      [provider]    Family History No family history on file.  Social History Social History   Tobacco Use  . Smoking status: Never Smoker  Substance Use Topics  . Alcohol use: No  . Drug use: No     Allergies   Patient has no known allergies.   Review of Systems Review of Systems  HENT: Positive for nosebleeds.   Cardiovascular: Positive for chest pain.  Neurological: Positive for headaches.  All other systems reviewed and are negative.    Physical Exam Updated Vital Signs BP (!) 158/99 (BP Location: Right Arm)   Pulse 77   Temp 98.2 F (36.8 C) (Oral)   Resp 16   SpO2 97%   Physical Exam  Constitutional: He is oriented to person, place, and time. He appears well-developed and well-nourished.  HENT:  Head: Normocephalic and atraumatic.  Eyes: Pupils are equal, round, and reactive to light. EOM are normal.  Fundoscopic exam:      The right eye shows no hemorrhage and no papilledema. The right eye shows no venous pulsations.       The left eye shows no hemorrhage and no papilledema. The left eye shows no venous pulsations.  Neck: Normal range of motion.  Cardiovascular: Normal rate and regular rhythm.  Pulmonary/Chest: Effort normal. No respiratory distress.  Abdominal: He exhibits no distension.  Musculoskeletal: Normal range of  motion.       Right lower leg: Normal.       Left lower leg: Normal.  Neurological: He is alert and oriented to person, place, and time. He is not disoriented. No cranial nerve deficit.  Skin: Skin is warm and dry.  Nursing note and vitals reviewed.    ED Treatments / Results  Labs (all labs ordered are listed, but only abnormal results are displayed) Labs Reviewed  BASIC METABOLIC PANEL  CBC  BRAIN NATRIURETIC PEPTIDE  SEDIMENTATION RATE  C-REACTIVE PROTEIN  I-STAT TROPONIN, ED    EKG EKG Interpretation  Date/Time:  Thursday November 02 2017 11:40:00 EDT Ventricular Rate:  95 PR Interval:  168 QRS Duration: 86 QT  Interval:  354 QTC Calculation: 444 R Axis:   46 Text Interpretation:  Normal sinus rhythm Normal ECG No old tracing to compare Confirmed by Marily Memos [phone]) on 11/02/2017 6:49:32 PM   Radiology Dg Chest 2 View  Result Date: 11/02/2017 CLINICAL DATA:  Chest pain. EXAM: CHEST - 2 VIEW COMPARISON:  Chest CT 06/20/2012.  Chest x-ray 06/19/2012. FINDINGS: Mediastinum and hilar structures normal. Heart size normal. No acute infiltrate. No pleural effusion or pneumothorax. IMPRESSION: No acute cardiopulmonary disease. Electronically Signed   By: Maisie Fus  Register   On: 11/02/2017 11:57   Ct Head Wo Contrast  Result Date: 11/02/2017 CLINICAL DATA:  57 y/o  M; throbbing headache to the temporal areas. EXAM: CT HEAD WITHOUT CONTRAST TECHNIQUE: Contiguous axial images were obtained from the base of the skull through the vertex without intravenous contrast. COMPARISON:  06/20/2012 CT head FINDINGS: Brain: No evidence of acute infarction, hemorrhage, hydrocephalus, extra-axial collection or mass lesion/mass effect. 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 Mild chronic microvascular ischemic changes and parenchymal volume loss of the brain with progression from 2013. Vascular: No hyperdense vessel or unexpected calcification. Skull: Normal. Negative for fracture or focal lesion. Sinuses/Orbits: No acute finding. Other: None. IMPRESSION: 1. No acute intracranial abnormality identified. 2. 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 Electronically Signed   By: Mitzi Hansen M.D.   On: 11/02/2017 20:04    Procedures Procedures (including critical care time)  Medications Ordered in ED Medications  acetaminophen (TYLENOL) tablet 650 mg (650 mg Oral Given 11/02/17 1355)  metoCLOPramide (REGLAN) injection 10 mg (10 mg Intravenous Given 11/02/17 2045)    diphenhydrAMINE (BENADRYL) injection 25 mg (25 mg Intravenous Given 11/02/17 2046)  lactated ringers bolus 1,000 mL (0 mLs Intravenous Stopped 11/02/17 2129)  ketorolac (TORADOL) 30 MG/ML injection 30 mg (30 mg Intravenous Given 11/02/17 2044)  dexamethasone (DECADRON) injection 10 mg (10 mg Intravenous Given 11/02/17 2048)  amLODipine (NORVASC) tablet 5 mg (5 mg Oral Given 11/02/17 2042)     Initial Impression / Assessment and Plan / ED Course  I have reviewed the triage vital signs and the nursing notes.  Pertinent labs & imaging results that were available during my care of the patient were reviewed by me and considered in my medical decision making (see chart for details).    New headache likely related to BP from undiagnosed OSA as he has episodes of waking at night breathless and coughing. Will add on BNP to complete cardiac workup. otherwise will scan head to ensure no tumor/bleed, HA cocktail, norvasc for BP. Foresee discharge with rx for bp meds and follow up for sleep study.    Final Clinical Impressions(s) /  ED Diagnoses   Final diagnoses:  Atypical chest pain  Nonintractable headache, unspecified chronicity pattern, unspecified headache type  Secondary hypertension    ED Discharge Orders        Ordered    amLODipine (NORVASC) 5 MG tablet  Daily     11/02/17 2205       Tyashia Morrisette, Barbara Cower, MD 11/03/17 [phone]

## 2017-11-02 NOTE — ED Notes (Signed)
Signature pad unavailable to patient at time of discharge. Pt verbalized understanding of instructions and prescriptions.

## 2017-11-08 MED FILL — FLUTICASONE PROP 50 MCG SPR: 50 | 60 days supply | Qty: 16 | Fill #0

## 2017-11-08 MED FILL — CYCLOBENZAPRINE 10 MG TAB: 10 | 30 days supply | Qty: 30 | Fill #0

## 2017-11-08 MED FILL — IBUPROFEN 800 MG TABLET: 800 | 30 days supply | Qty: 90 | Fill #0

## 2017-12-14 MED FILL — IBUPROFEN 800 MG TABLET: 800 | 30 days supply | Qty: 90 | Fill #1

## 2017-12-14 MED FILL — AMLODIPINE BESYLATE 5 MG TA: 5 | 30 days supply | Qty: 30 | Fill #0

## 2017-12-14 MED FILL — ATORVASTATIN 20 MG TABLET: 20 | 30 days supply | Qty: 30 | Fill #0

## 2017-12-21 MED FILL — traMADol HCL 50 MG TABS: 50 | 30 days supply | Qty: 60 | Fill #0

## 2018-02-13 MED FILL — IBUPROFEN 800 MG TABLET: 800 | 30 days supply | Qty: 90 | Fill #2

## 2018-02-13 MED FILL — AMLODIPINE BESYLATE 5 MG TA: 5 | 30 days supply | Qty: 30 | Fill #1

## 2018-03-20 ENCOUNTER — Ambulatory Visit: Payer: Medicaid Other | Admitting: Family Medicine

## 2018-03-28 MED FILL — IBUPROFEN 800 MG TABLET: 800 | 30 days supply | Qty: 90 | Fill #3

## 2018-03-28 MED FILL — AMLODIPINE BESYLATE 5 MG TA: 5 | 30 days supply | Qty: 30 | Fill #2

## 2018-04-04 ENCOUNTER — Ambulatory Visit: Payer: Medicaid Other | Admitting: Family Medicine

## 2018-04-06 ENCOUNTER — Encounter: Payer: Self-pay | Admitting: Family Medicine

## 2018-04-06 ENCOUNTER — Ambulatory Visit: Payer: Self-pay | Attending: Family Medicine | Admitting: Family Medicine

## 2018-04-06 VITALS — BP 142/90 | HR 78 | Temp 98.1°F | Resp 16 | Ht 66.0 in | Wt 183.6 lb

## 2018-04-06 DIAGNOSIS — J309 Allergic rhinitis, unspecified: Secondary | ICD-10-CM

## 2018-04-06 DIAGNOSIS — G8929 Other chronic pain: Secondary | ICD-10-CM

## 2018-04-06 DIAGNOSIS — S61219A Laceration without foreign body of unspecified finger without damage to nail, initial encounter: Secondary | ICD-10-CM

## 2018-04-06 DIAGNOSIS — Z23 Encounter for immunization: Secondary | ICD-10-CM

## 2018-04-06 DIAGNOSIS — S61213A Laceration without foreign body of left middle finger without damage to nail, initial encounter: Secondary | ICD-10-CM | POA: Insufficient documentation

## 2018-04-06 DIAGNOSIS — Z79899 Other long term (current) drug therapy: Secondary | ICD-10-CM | POA: Insufficient documentation

## 2018-04-06 DIAGNOSIS — I10 Essential (primary) hypertension: Secondary | ICD-10-CM

## 2018-04-06 DIAGNOSIS — M5442 Lumbago with sciatica, left side: Secondary | ICD-10-CM

## 2018-04-06 DIAGNOSIS — Z8249 Family history of ischemic heart disease and other diseases of the circulatory system: Secondary | ICD-10-CM | POA: Insufficient documentation

## 2018-04-06 DIAGNOSIS — H9313 Tinnitus, bilateral: Secondary | ICD-10-CM

## 2018-04-06 DIAGNOSIS — K219 Gastro-esophageal reflux disease without esophagitis: Secondary | ICD-10-CM

## 2018-04-06 DIAGNOSIS — T148XXA Other injury of unspecified body region, initial encounter: Secondary | ICD-10-CM

## 2018-04-06 DIAGNOSIS — E010 Iodine-deficiency related diffuse (endemic) goiter: Secondary | ICD-10-CM

## 2018-04-06 DIAGNOSIS — X58XXXA Exposure to other specified factors, initial encounter: Secondary | ICD-10-CM | POA: Insufficient documentation

## 2018-04-06 DIAGNOSIS — L089 Local infection of the skin and subcutaneous tissue, unspecified: Secondary | ICD-10-CM

## 2018-04-06 DIAGNOSIS — S61412A Laceration without foreign body of left hand, initial encounter: Secondary | ICD-10-CM

## 2018-04-06 DIAGNOSIS — E7800 Pure hypercholesterolemia, unspecified: Secondary | ICD-10-CM | POA: Insufficient documentation

## 2018-04-06 MED ORDER — GABAPENTIN 100 MG PO CAPS: ORAL_CAPSULE | ORAL | 3 refills | Status: DC

## 2018-04-06 MED ORDER — CETIRIZINE HCL 10 MG PO TABS: 10.0000 mg | ORAL_TABLET | Freq: Every day | ORAL | 11 refills | Status: DC

## 2018-04-06 MED ORDER — AMLODIPINE BESYLATE 5 MG PO TABS: 5.0000 mg | ORAL_TABLET | Freq: Every day | ORAL | 6 refills | Status: DC

## 2018-04-06 MED ORDER — OMEPRAZOLE 20 MG PO CPDR: 20.0000 mg | DELAYED_RELEASE_CAPSULE | Freq: Every day | ORAL | 3 refills | Status: DC

## 2018-04-06 MED ORDER — AMOXICILLIN 500 MG PO CAPS: 500.0000 mg | ORAL_CAPSULE | Freq: Three times a day (TID) | ORAL | 0 refills | Status: DC

## 2018-04-06 MED ORDER — TRAMADOL HCL 50 MG PO TABS: 50.0000 mg | ORAL_TABLET | Freq: Three times a day (TID) | ORAL | 1 refills | Status: DC

## 2018-04-06 MED ORDER — TETANUS-DIPHTH-ACELL PERTUSSIS 5-2.5-18.5 LF-MCG/0.5 IM SUSP: 0.5000 mL | Freq: Once | INTRAMUSCULAR | 0 refills | Status: AC

## 2018-04-06 MED FILL — OMEPRAZOLE 20 MG CAP: 20 | 30 days supply | Qty: 30 | Fill #0

## 2018-04-06 MED FILL — traMADol HCL 50 MG TABS: 50 | 30 days supply | Qty: 30 | Fill #0

## 2018-04-06 MED FILL — AMOXICILLIN 500 MG CAPSULE: 500 | 10 days supply | Qty: 30 | Fill #0

## 2018-04-06 MED FILL — GABAPENTIN 100 MG CAPSULE: 100 | 30 days supply | Qty: 60 | Fill #0

## 2018-04-06 NOTE — Patient Instructions (Addendum)
Td Vaccine (Tetanus and Diphtheria): What You Need to Know 1. Why get vaccinated? Tetanus  and diphtheria are very serious diseases. They are rare in the [place] today, but people who do become infected often have severe complications. Td vaccine is used to protect adolescents and adults from both of these diseases. Both tetanus and diphtheria are infections caused by bacteria. Diphtheria spreads from person to person through coughing or sneezing. Tetanus-causing bacteria enter the body through cuts, scratches, or wounds. TETANUS (lockjaw) causes painful muscle tightening and stiffness, usually all over the body.  It can lead to tightening of muscles in the head and neck so you can't open your mouth, swallow, or sometimes even breathe. Tetanus kills about 1 out of every 10 people who are infected even after receiving the best medical care.  DIPHTHERIA can cause a thick coating to form in the back of the throat.  It can lead to breathing problems, paralysis, heart failure, and death.  Before vaccines, as many as 200,000 cases of diphtheria and hundreds of cases of tetanus were reported in the [place] each year. Since vaccination began, reports of cases for both diseases have dropped by about 99%. 2. Td vaccine Td vaccine can protect adolescents and adults from tetanus and diphtheria. Td is usually given as a booster dose every 10 years but it can also be given earlier after a severe and dirty wound or burn. Another vaccine, called Tdap, which protects against pertussis in addition to tetanus and diphtheria, is sometimes recommended instead of Td vaccine. Your doctor or the person giving you the vaccine can give you more information. Td may safely be given at the same time as other vaccines. 3. Some people should not get this vaccine  A person who has ever had a life-threatening allergic reaction after a previous dose of any tetanus or diphtheria containing vaccine, OR has a severe  allergy to any part of this vaccine, should not get Td vaccine. Tell the person giving the vaccine about any severe allergies.  Talk to your doctor if you: ? had severe pain or swelling after any vaccine containing diphtheria or tetanus, ? ever had a condition called Guillain Barre Syndrome (GBS), ? aren't feeling well on the day the shot is scheduled. 4. What are the risks from Td vaccine? With any medicine, including vaccines, there is a chance of side effects. These are usually mild and go away on their own. Serious reactions are also possible but are rare. Most people who get Td vaccine do not have any problems with it. Mild problems following Td vaccine: (Did not interfere with activities)  Pain where the shot was given (about 8 people in 10)  Redness or swelling where the shot was given (about 1 person in 4)  Mild fever (rare)  Headache (about 1 person in 4)  Tiredness (about 1 person in 4)  Moderate problems following Td vaccine: (Interfered with activities, but did not require medical attention)  Fever over 102F (rare)  Severe problems following Td vaccine: (Unable to perform usual activities; required medical attention)  Swelling, severe pain, bleeding and/or redness in the arm where the shot was given (rare).  Problems that could happen after any vaccine:  People sometimes faint after a medical procedure, including vaccination. Sitting or lying down for about 15 minutes can help prevent fainting, and injuries caused by a fall. Tell your doctor if you feel dizzy, or have vision changes or ringing in the ears.  Some people get   severe pain in the shoulder and have difficulty moving the arm where a shot was given. This happens very rarely.  Any medication can cause a severe allergic reaction. Such reactions from a vaccine are very rare, estimated at fewer than 1 in a million doses, and would happen within a few minutes to a few hours after the vaccination. As with any  medicine, there is a very remote chance of a vaccine causing a serious injury or death. The safety of vaccines is always being monitored. For more information, visit: http://floyd.org/ 5. What if there is a serious reaction? What should I look for? Look for anything that concerns you, such as signs of a severe allergic reaction, very high fever, or unusual behavior. Signs of a severe allergic reaction can include hives, swelling of the face and throat, difficulty breathing, a fast heartbeat, dizziness, and weakness. These would usually start a few minutes to a few hours after the vaccination. What should I do?  If you think it is a severe allergic reaction or other emergency that can't wait, call 9-1-1 or get the person to the nearest hospital. Otherwise, call your doctor.  Afterward, the reaction should be reported to the Vaccine Adverse Event Reporting System (VAERS). Your doctor might file this report, or you can do it yourself through the VAERS web site at www.vaers.LAgents.no, or by calling 1-770-009-1817. ? VAERS does not give medical advice. 6. The National Vaccine Injury Compensation Program The [employer] Vaccine Injury Compensation Program (VICP) is a federal program that was created to compensate people who may have been injured by certain vaccines. Persons who believe they may have been injured by a vaccine can learn about the program and about filing a claim by calling 1-470-778-5340 or visiting the VICP website at SpiritualWord.at. There is a time limit to file a claim for compensation. 7. How can I learn more?  Ask your doctor. He or she can give you the vaccine package insert or suggest other sources of information.  Call your local or state health department.  Contact the Centers for Disease Control and Prevention (CDC): ? Call 316-887-8939 (1-800-CDC-INFO) ? Visit CDC's website at PicCapture.uy CDC Td Vaccine VIS (10/20/15) This information is  not intended to replace advice given to you by your health care provider. Make sure you discuss any questions you have with your health care provider. Document Released: 04/24/2006 Document Revised: 03/17/2016 Document Reviewed: 03/17/2016 Elsevier Interactive Patient Education  2017 [employer]  Fingertip Infection There are two main types of fingertip infections:  Paronychia. This is an infection that happens around your nail. This type of infection can start suddenly in one nail or occur gradually over time and affect more than one nail. Long-term (chronic) paronychia can make your fingernails thick and deformed.  Felon. This is a bacterial infection in the padded tip of your finger. Felon infection can cause a painful collection of pus (abscess) to form inside your fingertip. If the infection is not treated, the infection can spread as deep as the bone.  What are the causes? Paronychia infection can be caused by bacteria, funguses, or a mix of both. Felon infection is usually caused by the bacteria that are normally found on your skin. An infection can develop if the bacteria spread through your skin to the pad of tissue inside your fingertip. What increases the risk? A fingertip infection is more likely to develop in people:  Who have diabetes.  Who have a weak body defense (immune) system.  Who  work with their hands.  Whose hands are exposed to moisture, chemicals, or irritants for long periods of time.  Who have poor circulation.  Who bite, chew, or pick their fingernails.  What are the signs or symptoms? Symptoms of paronychia may affect one or more fingernails and include:  Pain, swelling, and redness around the nail.  Pus-filled pockets at the base or side of the fingernail (cuticle).  Thick fingernails that separate from the nail bed.  Pus that drains from the nail bed.  Symptoms of felon usually affect just one fingertip pad and include:  Severe, throbbing  pain.  Redness.  Swelling.  Warmth.  Tenderness when the affected fingertip is touched.  How is this diagnosed? A fingertip infection is diagnosed with a medical history and physical exam. If there is pus draining from the infection, it may be swabbed and sent to the lab for a culture. An X-ray may be done to see if the infection has spread to the bone. How is this treated? Treatment for a fingertip infection may include:  Warm water or salt-water soaks several times per day.  Antibiotic medicine. This may be an ointment or pills.  Steroid ointment.  Antifungal pills.  Drainage of pus pockets. This is done by making a surgical cut (incision) to open the fingertip to drain pus.  Wearing gloves to protect your nails.  Follow these instructions at home: Medicines  Take or apply over-the-counter and prescription medicines only as told by your health care provider.  If you were prescribed antibiotic medicine, take or apply it as told by your health care provider. Do not stop using the antibiotic even if your condition improves. Wound care  Clean the infected area each day with warm water or salt water, or as told by your health care provider. ? Gently wash the infected area with mild soap and water. ? Rinse the infected area with water to remove all soap. ? Pat the infected area dry with a clean towel. Do not rub it. ? To make a salt-water mixture, completely dissolve -1 tsp of salt in 1 cup of warm water.  Follow instructions from your health care provider about: ? How to take care of the infection. ? When and how you should change your bandage (dressing). ? When you should remove your dressing.  Check the infected area every day for more signs of infection. Watch for: ? More redness, swelling, or pain. ? More fluid or blood. ? Warmth. ? A bad smell. General instructions  Keep the dressing dry until your health care provider says it can be removed. Do not take baths,  swim, use a hot tub, or do anything that would put your wound underwater until your health care provider approves.  Raise (elevate) the infected area above the level of your heart while you are sitting or lying down or as told by your health care provider.  Do not scratch or pick at the infection.  Wear gloves as told by your health care provider, if this applies.  Keep all follow-up visits as told by your health care provider. This is important. How is this prevented?  Wear gloves when you work with your hands.  Wash your hands often with antibacterial soap.  Avoid letting your hands stay wet or irritated for long periods of time.  Do not bite your fingernails. Do not pull on your cuticles. Do not suck on your fingers.  Use clean scissors or nail clippers to trim your nails. Do  not cut your fingernails very short. Contact a health care provider if:  Your pain medicine is not helping.  You have more redness, swelling, or pain at your fingertip.  You continue to have fluid, blood, or pus coming from your fingertip.  Your infection area feels warm to the touch.  You continue to notice a bad smell coming from your fingertip or your dressing. Get help right away if:  The area of redness is spreading, or you notice a red streak going away from your fingertip.  You have a fever. This information is not intended to replace advice given to you by your health care provider. Make sure you discuss any questions you have with your health care provider. Document Released: 08/04/2004 Document Revised: 12/03/2015 Document Reviewed: 12/15/2014 Elsevier Interactive Patient Education  2018 [employer].
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
## 2018-04-30 ENCOUNTER — Ambulatory Visit: Payer: Self-pay | Attending: Family Medicine

## 2018-05-03 ENCOUNTER — Encounter: Payer: Self-pay | Admitting: Family Medicine

## 2018-05-03 ENCOUNTER — Other Ambulatory Visit: Payer: Self-pay

## 2018-05-03 ENCOUNTER — Ambulatory Visit: Payer: Self-pay | Attending: Family Medicine

## 2018-05-03 ENCOUNTER — Ambulatory Visit: Payer: Self-pay | Attending: Family Medicine | Admitting: Family Medicine

## 2018-05-03 VITALS — BP 148/96 | HR 87 | Temp 98.1°F | Resp 18 | Ht 66.0 in | Wt 186.0 lb

## 2018-05-03 DIAGNOSIS — E7800 Pure hypercholesterolemia, unspecified: Secondary | ICD-10-CM | POA: Insufficient documentation

## 2018-05-03 DIAGNOSIS — Z8249 Family history of ischemic heart disease and other diseases of the circulatory system: Secondary | ICD-10-CM | POA: Insufficient documentation

## 2018-05-03 DIAGNOSIS — I10 Essential (primary) hypertension: Secondary | ICD-10-CM

## 2018-05-03 DIAGNOSIS — L299 Pruritus, unspecified: Secondary | ICD-10-CM

## 2018-05-03 DIAGNOSIS — G8929 Other chronic pain: Secondary | ICD-10-CM

## 2018-05-03 DIAGNOSIS — M5442 Lumbago with sciatica, left side: Secondary | ICD-10-CM

## 2018-05-03 MED ORDER — IBUPROFEN 600 MG PO TABS: 600.0000 mg | ORAL_TABLET | Freq: Three times a day (TID) | ORAL | 3 refills | Status: DC | PRN

## 2018-05-03 MED ORDER — KETOCONAZOLE 2 % EX SHAM: 1.0000 "application " | MEDICATED_SHAMPOO | CUTANEOUS | 4 refills | Status: DC

## 2018-05-03 MED ORDER — GABAPENTIN 100 MG PO CAPS: ORAL_CAPSULE | ORAL | 3 refills | Status: DC

## 2018-05-03 MED ORDER — AMLODIPINE BESYLATE 5 MG PO TABS: 5.0000 mg | ORAL_TABLET | Freq: Every day | ORAL | 4 refills | Status: DC

## 2018-05-03 MED ORDER — CYCLOBENZAPRINE HCL 10 MG PO TABS: 10.0000 mg | ORAL_TABLET | Freq: Every day | ORAL | 2 refills | Status: DC

## 2018-05-03 MED ORDER — PREDNISONE 20 MG PO TABS: ORAL_TABLET | ORAL | 0 refills | Status: DC

## 2018-05-03 NOTE — Progress Notes (Signed)
Flu: no  Pain:   Patient requested for medicated shampoo for dandruff.   Patient requested for all medications to be refilled.   Patient stated that he has been off of medications for 3 days now.
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
## 2018-05-04 MED FILL — ?AMLODIPINE BESYLATE 5 MG T: 5 MG | 30 days supply | Qty: 30 | Fill #0

## 2018-05-04 MED FILL — predniSONE 20 MG TABS: 20 | 8 days supply | Qty: 8 | Fill #0

## 2018-05-04 MED FILL — IBUPROFEN 600 MG TABLET: 600 | 10 days supply | Qty: 30 | Fill #0

## 2018-05-04 MED FILL — CYCLOBENZAPRINE 10 MG TAB: 10 | 30 days supply | Qty: 30 | Fill #0

## 2018-05-04 MED FILL — GABAPENTIN 100 MG CAPSULE: 100 | 30 days supply | Qty: 60 | Fill #0

## 2018-05-10 ENCOUNTER — Ambulatory Visit (HOSPITAL_COMMUNITY)
Admission: RE | Admit: 2018-05-10 | Discharge: 2018-05-10 | Disposition: A | Discharge status: Home / Self Care | Payer: Medicaid Other | Source: Ambulatory Visit | Attending: Family Medicine | Admitting: Family Medicine

## 2018-05-10 DIAGNOSIS — M4186 Other forms of scoliosis, lumbar region: Secondary | ICD-10-CM | POA: Insufficient documentation

## 2018-05-10 DIAGNOSIS — M5442 Lumbago with sciatica, left side: Secondary | ICD-10-CM | POA: Insufficient documentation

## 2018-05-10 DIAGNOSIS — G8929 Other chronic pain: Secondary | ICD-10-CM | POA: Insufficient documentation

## 2018-05-10 DIAGNOSIS — M47896 Other spondylosis, lumbar region: Secondary | ICD-10-CM | POA: Insufficient documentation

## 2018-05-18 ENCOUNTER — Telehealth: Payer: Self-pay | Admitting: *Deleted

## 2018-05-18 ENCOUNTER — Ambulatory Visit: Payer: Medicaid Other | Admitting: Family Medicine

## 2018-05-18 NOTE — Telephone Encounter (Signed)
-----   Message from Cain Saupe, MD sent at 05/17/2018 12:06 AM EST ----- Please notify patient that his x-ray of his lumbar spine showed mild scoliosis as well as mild degenerative/arthritis but overall no acute bony abnormality changes

## 2018-05-18 NOTE — Telephone Encounter (Signed)
Medical Assistant left message on patient's home and cell voicemail. Voicemail states to give a call back to [place] with [facility] at [phone]. Patient is aware of no acute abnormalities being noted and patient has mild scoliosis and arthritis present which is not new.

## 2018-05-25 ENCOUNTER — Encounter: Payer: Self-pay | Admitting: Family Medicine

## 2018-05-25 ENCOUNTER — Ambulatory Visit: Payer: Self-pay | Attending: Family Medicine | Admitting: Family Medicine

## 2018-05-25 VITALS — BP 127/79 | HR 83 | Temp 98.2°F | Ht 66.0 in | Wt 183.4 lb

## 2018-05-25 DIAGNOSIS — M546 Pain in thoracic spine: Secondary | ICD-10-CM

## 2018-05-25 DIAGNOSIS — J309 Allergic rhinitis, unspecified: Secondary | ICD-10-CM

## 2018-05-25 DIAGNOSIS — E785 Hyperlipidemia, unspecified: Secondary | ICD-10-CM | POA: Insufficient documentation

## 2018-05-25 DIAGNOSIS — M5440 Lumbago with sciatica, unspecified side: Secondary | ICD-10-CM

## 2018-05-25 DIAGNOSIS — Z79899 Other long term (current) drug therapy: Secondary | ICD-10-CM | POA: Insufficient documentation

## 2018-05-25 DIAGNOSIS — M47816 Spondylosis without myelopathy or radiculopathy, lumbar region: Secondary | ICD-10-CM | POA: Insufficient documentation

## 2018-05-25 DIAGNOSIS — R351 Nocturia: Secondary | ICD-10-CM | POA: Insufficient documentation

## 2018-05-25 DIAGNOSIS — I10 Essential (primary) hypertension: Secondary | ICD-10-CM | POA: Insufficient documentation

## 2018-05-25 DIAGNOSIS — M25521 Pain in right elbow: Secondary | ICD-10-CM

## 2018-05-25 DIAGNOSIS — M549 Dorsalgia, unspecified: Secondary | ICD-10-CM

## 2018-05-25 DIAGNOSIS — G8929 Other chronic pain: Secondary | ICD-10-CM | POA: Insufficient documentation

## 2018-05-25 DIAGNOSIS — M5442 Lumbago with sciatica, left side: Secondary | ICD-10-CM

## 2018-05-25 DIAGNOSIS — Z8249 Family history of ischemic heart disease and other diseases of the circulatory system: Secondary | ICD-10-CM | POA: Insufficient documentation

## 2018-05-25 DIAGNOSIS — R350 Frequency of micturition: Secondary | ICD-10-CM | POA: Insufficient documentation

## 2018-05-25 DIAGNOSIS — R310 Gross hematuria: Secondary | ICD-10-CM | POA: Insufficient documentation

## 2018-05-25 DIAGNOSIS — M545 Low back pain, unspecified: Secondary | ICD-10-CM

## 2018-05-25 DIAGNOSIS — M419 Scoliosis, unspecified: Secondary | ICD-10-CM | POA: Insufficient documentation

## 2018-05-25 LAB — POCT URINALYSIS DIP (CLINITEK)
Bilirubin, UA: NEGATIVE
Glucose, UA: NEGATIVE mg/dL
Ketones, POC UA: NEGATIVE mg/dL
Leukocytes, UA: NEGATIVE
Nitrite, UA: NEGATIVE
POC PROTEIN,UA: 100 — AB
Spec Grav, UA: 1.02
Urobilinogen, UA: 0.2 U/dL
pH, UA: 7

## 2018-05-25 MED ORDER — GABAPENTIN 100 MG PO CAPS: ORAL_CAPSULE | ORAL | 3 refills | Status: DC

## 2018-05-25 MED ORDER — TRAMADOL HCL 50 MG PO TABS: 50.0000 mg | ORAL_TABLET | Freq: Three times a day (TID) | ORAL | 3 refills | Status: AC

## 2018-05-25 MED ORDER — TRIAMCINOLONE ACETONIDE 55 MCG/ACT NA AERO: 2.0000 | INHALATION_SPRAY | Freq: Every day | NASAL | 12 refills | Status: DC

## 2018-05-25 MED ORDER — CYCLOBENZAPRINE HCL 10 MG PO TABS: 10.0000 mg | ORAL_TABLET | Freq: Every day | ORAL | 3 refills | Status: DC

## 2018-05-25 MED ORDER — TRIAMCINOLONE ACETONIDE(NASAL) 55 MCG/ACT NA INHA: 2.0000 | Freq: Every day | NASAL | 11 refills | Status: DC

## 2018-05-25 MED ORDER — IBUPROFEN 600 MG PO TABS: 600.0000 mg | ORAL_TABLET | Freq: Three times a day (TID) | ORAL | 3 refills | Status: DC | PRN

## 2018-05-25 MED ORDER — AMLODIPINE BESYLATE 5 MG PO TABS: 5.0000 mg | ORAL_TABLET | Freq: Every day | ORAL | 4 refills | Status: DC

## 2018-05-25 MED ORDER — DICLOFENAC SODIUM 1 % TD GEL: TRANSDERMAL | 6 refills | Status: AC

## 2018-05-25 MED FILL — FLUTICASONE PROP 50 MCG SPR: 50 | 60 days supply | Qty: 16 | Fill #1

## 2018-05-25 MED FILL — IBUPROFEN 600 MG TABLET: 600 | 10 days supply | Qty: 30 | Fill #0

## 2018-05-25 MED FILL — DICLOFENAC SODIUM 1% GEL: 1 | 12 days supply | Qty: 100 | Fill #0

## 2018-05-25 MED FILL — traMADol HCL 50 MG TABS: 50 | 30 days supply | Qty: 60 | Fill #0

## 2018-05-25 MED FILL — AMLODIPINE BESYLATE 5 MG TA: 5 | 30 days supply | Qty: 30 | Fill #0

## 2018-05-25 NOTE — Progress Notes (Signed)
Patient is having pain in lower back,elbows and legs.

## 2018-05-25 NOTE — Progress Notes (Signed)
Subjective:    Patient ID: Howard Rice, male    DOB: 06/29/1961, 57 y.o.   MRN: [id number]  HPI  57 yo male who was seen in follow-up of hypertension and low back pain.  Patient also with a history of hyperlipidemia for which he will have lipid panel done at today's visit.  Patient states that his back pain has decreased with the use of medications prescribed at his last visit from a 10 down to about a 7 currently.  Patient also with complaint of recurrent pain in his right elbow.  Patient states that he always has pain in the right elbow at about a 5 or 6 but if he hits the tip of his elbow on anything, he gets a sharp, electric type pain that is a 10 or greater on a 0-to-10 scale.  Patient also continues to feel as if he has some weakness in his left lower leg when he tries to extend his leg such as trying to press down on something with his foot.  Patient states he continues to have radiation of a sharp pain down the right leg to the knee but states that this is also better since his last visit.  Patient states that the ibuprofen and Flexeril helped but patient continues to feel as if he needs to take tramadol to reduce the pain to a tolerable level.  Patient did obtain an x-ray of his back after his last visit.      Patient reports that the blood pressure medication has worked very well.  Patient reports no headaches or dizziness related to his blood pressure.  Patient states that he forgot to bring it up at a previous visit but patient has noticed occasional blood in his urine.  Patient states that this is been occurring for more than a month.  Patient states that he has also noticed that he has to get up at night to urinate.  Patient states that he has to get up at least twice or more per night to urinate and patient does feel as if he has had some weakening of his urinary stream. Past Medical History:  Diagnosis Date  . Hypercholesteremia   . Hypertension    Family History  Problem Relation Age  of Onset  . Hypertension Mother   . Hypertension Father    Social History   Tobacco Use  . Smoking status: Never Smoker  . Smokeless tobacco: Never Used  Substance Use Topics  . Alcohol use: No  . Drug use: No  No Known Allergies   Review of Systems  Constitutional: Positive for fatigue. Negative for chills and fever.  HENT: Positive for congestion and postnasal drip. Negative for sore throat and trouble swallowing.   Respiratory: Negative for cough and shortness of breath.   Cardiovascular: Negative for chest pain, palpitations and leg swelling.  Gastrointestinal: Negative for abdominal pain and nausea.  Endocrine: Negative for polydipsia and polyphagia.  Genitourinary: Positive for frequency and hematuria. Negative for dysuria.  Musculoskeletal: Positive for arthralgias, back pain, joint swelling and myalgias. Negative for gait problem.  Neurological: Negative for dizziness and headaches.       Objective:   Physical Exam BP 127/79   Pulse 83   Temp 98.2 F (36.8 C) (Oral)   Ht 5\' 6"  (1.676 m)   Wt 183 lb 6.4 oz (83.2 kg)   BMI 29.60 kg/m Nurse's notes and vital signs reviewed General-well-nourished, well-developed older male in no acute distress Neck-supple, no lymphadenopathy,  no thyromegaly, no carotid bruit Lungs-clear to auscultation bilaterally Cardiovascular-regular rate and rhythm Back- patient with some mild kyphosis versus muscular hypertrophy of the mid back right greater than left and patient with scoliosis in the thoracic spine/lumbar area.  Patient with marked muscle spasm of the thoracic and lumbar spine area.  Patient with some lumbosacral tenderness to palpation.  No CVA tenderness. Abdomen-soft, nontender Musculoskeletal- patient with a palpable firm nodule versus bony growth at the tip of the right elbow/olecranon process in this area is tender to palpation Extremities-no edema        Assessment & Plan:  1. Chronic bilateral thoracic back  pain Patient with back pain and xray of the lumbar spine shows degenerative changes as well as scoliosis. Patient agrees to be referred to physical therapy for further evaluation and treatment but also requests refill of tramadol to help with his chronic pain issues. Tramadol refilled but I would like to be able to decrease this over time. - Ambulatory referral to Physical Therapy  2. Low back pain with radiation Patient will be referred to PT.  Pt did feel that the prednisone taper helped as well as the flexeril.  - Ambulatory referral to Physical Therapy - Comprehensive metabolic panel - cyclobenzaprine (FLEXERIL) 10 MG tablet; Take 1 tablet (10 mg total) by mouth at bedtime. For muscle spasms.  Dispense: 30 tablet; Refill: 3 - gabapentin (NEURONTIN) 100 MG capsule; Take 1-2 capsules at bedtime for nerve type pain  Dispense: 60 capsule; Refill: 3 - ibuprofen (ADVIL,MOTRIN) 600 MG tablet; Take 1 tablet (600 mg total) by mouth every 8 (eight) hours as needed.  Dispense: 30 tablet; Refill: 3 - traMADol (ULTRAM) 50 MG tablet; Take 1 tablet (50 mg total) by mouth 3 (three) times daily. As needed for pain; 30 day supply, no early refill  Dispense: 60 tablet; Refill: 3  3. Essential hypertension Patient's blood pressure is now controlled with the use of amlodipine 5 mg daily. - POCT URINALYSIS DIP (CLINITEK) - Comprehensive metabolic panel - Lipid panel - amLODipine (NORVASC) 5 MG tablet; Take 1 tablet (5 mg total) by mouth daily. To lower blood pressure  Dispense: 30 tablet; Refill: 4  4. Gross hematuria Patient reports that he has had more than a month of seeing occasional blood in his urine.  Patient also with urinary frequency and nocturia.  Patient will have urinalysis to look for possible infection as well as PSA.  Patient will also be referred to urology for further evaluation and treatment -Patient POCT URINALYSIS DIP (CLINITEK) - Ambulatory referral to Urology - PSA  5. Elbow pain,  chronic, right Patient with pain at the tip of the right elbow.  Patient will have uric acid level to see if this might be gout but also discussed with the patient that this may represent bursitis.  Patient will be referred to orthopedics for further evaluation and treatment.  Prescription provided for Voltaren gel which patient may apply to the area up to 4 times daily as needed for pain - Uric Acid - Ambulatory referral to Orthopedic Surgery  6. Hyperlipidemia, unspecified hyperlipidemia type Patient with a history of hyperlipidemia but is not currently on medication to lower his cholesterol.  Patient will have lipid panel at today's visit as well as CMP in case of the need for statin use.  Low-fat diet is recommended and patient will be notified of lab results and if further treatment is indicated based on the results. - Comprehensive metabolic panel - Lipid panel  7. Encounter for long-term (current) use of medications Patient will have CMP at today's visit in follow-up of long-term use of medications for the treatment of chronic pain as well as hypertension. - Comprehensive metabolic panel  8. Chronic midline low back pain with left-sided sciatica Patient with complaint of continued low back pain with radiation.  Patient given refills of ibuprofen, Flexeril and Neurontin.  Patient also agrees to referral to physical therapy  9. Nocturia Patient with complaint of issues with nocturia.  Patient will have urinalysis to look for urinary tract infection and PSA.  Patient is also being referred to urology as patient has also had issues with hematuria - PSA  10.  Allergic rhinitis Patient provided with refill of Nasonex for treatment of allergic rhinitis  An After Visit Summary was printed and given to the patient.  Return in about 3 months (around 08/25/2018) for HTN; 3 months and as needed.

## 2018-05-26 LAB — COMPREHENSIVE METABOLIC PANEL WITH GFR
ALT: 13 IU/L (ref 0–44)
AST: 13 IU/L (ref 0–40)
Albumin/Globulin Ratio: 1.7 (ref 1.2–2.2)
Albumin: 4.5 g/dL (ref 3.5–5.5)
Alkaline Phosphatase: 82 IU/L (ref 39–117)
BUN/Creatinine Ratio: 15 (ref 9–20)
BUN: 17 mg/dL (ref 6–24)
Bilirubin Total: 0.9 mg/dL (ref 0.0–1.2)
CO2: 24 mmol/L (ref 20–29)
Calcium: 9.4 mg/dL (ref 8.7–10.2)
Chloride: 100 mmol/L (ref 96–106)
Creatinine, Ser: 1.17 mg/dL (ref 0.76–1.27)
GFR calc Af Amer: 78 mL/min/1.73
GFR calc non Af Amer: 68 mL/min/1.73
Globulin, Total: 2.6 g/dL (ref 1.5–4.5)
Glucose: 136 mg/dL — ABNORMAL HIGH (ref 65–99)
Potassium: 4.2 mmol/L (ref 3.5–5.2)
Sodium: 140 mmol/L (ref 134–144)
Total Protein: 7.1 g/dL (ref 6.0–8.5)

## 2018-05-26 LAB — LIPID PANEL
Chol/HDL Ratio: 6.5 ratio — ABNORMAL HIGH (ref 0.0–5.0)
Cholesterol, Total: 271 mg/dL — ABNORMAL HIGH (ref 100–199)
HDL: 42 mg/dL
LDL Calculated: 184 mg/dL — ABNORMAL HIGH (ref 0–99)
Triglycerides: 223 mg/dL — ABNORMAL HIGH (ref 0–149)
VLDL Cholesterol Cal: 45 mg/dL — ABNORMAL HIGH (ref 5–40)

## 2018-05-26 LAB — URIC ACID: Uric Acid: 5.8 mg/dL (ref 3.7–8.6)

## 2018-05-26 LAB — PSA: Prostate Specific Ag, Serum: 0.5 ng/mL (ref 0.0–4.0)

## 2018-05-27 ENCOUNTER — Other Ambulatory Visit: Payer: Self-pay | Admitting: Family Medicine

## 2018-05-27 DIAGNOSIS — E782 Mixed hyperlipidemia: Secondary | ICD-10-CM

## 2018-05-27 DIAGNOSIS — Z79899 Other long term (current) drug therapy: Secondary | ICD-10-CM

## 2018-05-27 DIAGNOSIS — R739 Hyperglycemia, unspecified: Secondary | ICD-10-CM

## 2018-05-27 DIAGNOSIS — N3001 Acute cystitis with hematuria: Secondary | ICD-10-CM

## 2018-05-27 LAB — URINE CULTURE

## 2018-05-27 MED ORDER — SULFAMETHOXAZOLE-TRIMETHOPRIM 800-160 MG PO TABS: 1.0000 | ORAL_TABLET | Freq: Two times a day (BID) | ORAL | 0 refills | Status: DC

## 2018-05-27 MED ORDER — ATORVASTATIN CALCIUM 20 MG PO TABS: 20.0000 mg | ORAL_TABLET | Freq: Every day | ORAL | 5 refills | Status: DC

## 2018-05-27 NOTE — Progress Notes (Signed)
Patient ID: Howard RobertsonSayid A Rice, male   DOB: 08/22/1960, 57 y.o.   MRN: [id number]   Patient status post recent blood work and urine culture done in follow-up of urinary frequency.  Patient did have elevated glucose of 136.  Order will be placed for patient to return for hemoglobin A1c to see if diabetes or prediabetes may be contributing to his urinary frequency.  Patient had urine culture done which showed 10,000 colony-forming units of bacteria per mL of urine which is generally considered to not be significant however in light of patient's symptoms, prescription will be sent to patient's pharmacy for Septra double strength twice daily x7 days in case of urinary tract infection.      Patient also had lipid panel which showed LDL of 184.  Prescription will be sent to patient's pharmacy for Lipitor 20 mg daily with repeat hepatic function panel in 4 to 6 weeks after starting the medication.  Patient will have repeat lipid panel in 4 to 6 months.  Patient will also be notified to start a low carbohydrate diet along with regular exercise.

## 2018-05-28 MED FILL — ATORVASTATIN 20 MG TABLET: 20 | 30 days supply | Qty: 30 | Fill #0

## 2018-05-31 ENCOUNTER — Telehealth: Payer: Self-pay | Admitting: *Deleted

## 2018-05-31 MED FILL — KETOCONAZOLE 2% SHAMPOO: 2 | 30 days supply | Qty: 120 | Fill #0

## 2018-05-31 NOTE — Telephone Encounter (Signed)
-----   Message from Cain Saupeammie Fulp, MD sent at 05/27/2018  5:58 PM EST ----- Please notify patient that his urine culture showed less than 10,000 colony-forming units of bacteria which is generally not considered to be significant.  Since patient is having urinary symptoms however I will send an antibiotic to his pharmacy.  Patient has also been referred to urology and he should call the clinic on Thursday or Friday if he is not heard anything by then regarding his referral.  Patient also had a normal prostate-specific antigen test.  Patient's blood sugar was elevated at 136 therefore would recommend a hemoglobin A1c if he has not had one done recently to see if he may be prediabetic or diabetic.  Patient's uric acid level was within normal at 5.8.  Patient did have elevated cholesterol with a total cholesterol of 271, triglycerides of 223, and LDL elevated at 184.  A cholesterol medication will be sent to his pharmacy.  If patient chooses to start the cholesterol medication, he should return in approximately 4 to 6 weeks after starting the medication to have a recheck of his liver function test.  Patient should follow a low carbohydrate diet and start a regular exercise program.

## 2018-05-31 NOTE — Telephone Encounter (Signed)
Left message on voicemail to return call, unable to reach patient.

## 2018-06-01 ENCOUNTER — Telehealth (INDEPENDENT_AMBULATORY_CARE_PROVIDER_SITE_OTHER): Payer: Self-pay

## 2018-06-01 NOTE — Telephone Encounter (Signed)
-----   Message from Cammie Fulp, MD sent at 05/27/2018  5:58 PM EST ----- Please notify patient that his urine culture showed less than 10,000 colony-forming units of bacteria which is generally not considered to be significant.  Since patient is having urinary symptoms however I will send an antibiotic to his pharmacy.  Patient has also been referred to urology and he should call the clinic on Thursday or Friday if he is not heard anything by then regarding his referral.  Patient also had a normal prostate-specific antigen test.  Patient's blood sugar was elevated at 136 therefore would recommend a hemoglobin A1c if he has not had one done recently to see if he may be prediabetic or diabetic.  Patient's uric acid level was within normal at 5.8.  Patient did have elevated cholesterol with a total cholesterol of 271, triglycerides of 223, and LDL elevated at 184.  A cholesterol medication will be sent to his pharmacy.  If patient chooses to start the cholesterol medication, he should return in approximately 4 to 6 weeks after starting the medication to have a recheck of his liver function test.  Patient should follow a low carbohydrate diet and start a regular exercise program. 

## 2018-06-01 NOTE — Telephone Encounter (Signed)
Patient is aware that his urine culture showed less than 10,000 colony-forming units of bacteria which is generally not considered to be significant. Since patient is having urinary symptoms however I will send an antibiotic to his pharmacy. Patient has also been referred to urology and he should call the clinic on Thursday or Friday if he is not heard anything by then regarding his referral. Patient also had a normal prostate-specific antigen test. Patient's blood sugar was elevated at 136 therefore would recommend a hemoglobin A1c if he has not had one done recently to see if he may be prediabetic or diabetic. Patient's uric acid level was within normal at 5.8. Patient did have elevated cholesterol with a total cholesterol of 271, triglycerides of 223, and LDL elevated at 184. A cholesterol medication will be sent to his pharmacy. If patient chooses to start the cholesterol medication, he should return in approximately 4 to 6 weeks after starting the medication to  have a recheck of his liver function test. Patient should follow a low carbohydrate diet and start a regular exercise program. Maryjean Mornempestt S Roberts, CMA

## 2018-06-15 ENCOUNTER — Ambulatory Visit: Payer: Medicaid Other | Attending: Family Medicine | Admitting: Physical Therapy

## 2018-06-25 MED FILL — AMLODIPINE BESYLATE 5 MG TA: 5 | 30 days supply | Qty: 30 | Fill #1

## 2018-06-25 MED FILL — ATORVASTATIN 20 MG TABLET: 20 | 30 days supply | Qty: 30 | Fill #1

## 2018-06-25 MED FILL — SULFAMETHOXAZOLE-TMP DS TAB: 800-160 | 7 days supply | Qty: 14 | Fill #0

## 2018-06-25 MED FILL — IBUPROFEN 600 MG TABLET: 600 | 10 days supply | Qty: 30 | Fill #1

## 2018-06-25 MED FILL — traMADol HCL 50 MG TABS: 50 | 30 days supply | Qty: 60 | Fill #1

## 2018-07-20 ENCOUNTER — Ambulatory Visit (INDEPENDENT_AMBULATORY_CARE_PROVIDER_SITE_OTHER): Payer: Self-pay | Admitting: Orthopaedic Surgery

## 2018-07-20 ENCOUNTER — Ambulatory Visit (INDEPENDENT_AMBULATORY_CARE_PROVIDER_SITE_OTHER): Payer: Self-pay

## 2018-07-20 DIAGNOSIS — M25721 Osteophyte, right elbow: Secondary | ICD-10-CM

## 2018-07-20 NOTE — Progress Notes (Signed)
Office Visit Note   Patient: Howard Rice           Date of Birth: [birth year]           MRN: [id number] Visit Date: 07/20/2018              Requested by: Cain Saupe, MD [address] [place], [place] [address] PCP: Cain Saupe, MD   Assessment & Plan: Visit Diagnoses:  1. 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  We discussed that the definitive treatment for him would be surgical excision and repair versus reattachment of the triceps tendon.  Risks and benefits and rehab and recovery were reviewed.  We also discussed nonsurgical treatment with elbow pads.  He will think about this and let us know.  Follow-up as needed.  Follow-Up Instructions: Return if symptoms worsen or fail to improve.   Orders:  Orders Placed This Encounter  Procedures  . XR Elbow Complete Right (3+View)   No orders of the defined types were placed in this encounter.     Procedures: No procedures performed   Clinical Data: No additional findings.   Subjective: Chief Complaint  Patient presents with  . Right Elbow - Pain    Patient is a 58 year old gentleman who comes in with posterior right elbow pain that is sensitive to touch and pressure.  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  He denies any injuries.  He states that this causes shooting pain down into his arm.   Review of Systems  Constitutional: Negative.   All other systems reviewed and are negative.    Objective: Vital Signs: There were no vitals taken for this visit.  Physical Exam Vitals signs and nursing note reviewed.  Constitutional:      Appearance: He is well-developed.  HENT:     Head: Normocephalic and atraumatic.  Eyes:     Pupils: Pupils are equal, round, and reactive to light.  Neck:     Musculoskeletal: Neck supple.  Pulmonary:     Effort: Pulmonary effort  is normal.  Abdominal:     Palpations: Abdomen is soft.  Musculoskeletal: Normal range of motion.  Skin:    General: Skin is warm.  Neurological:     Mental Status: He is alert and oriented to person, place, and time.  Psychiatric:        Behavior: Behavior normal.        Thought Content: Thought content normal.        Judgment: Judgment normal.     Ortho Exam Right elbow exam shows point tenderness of the olecranon process and osteophyte.  He has normal elbow range of motion and normal triceps strength.  There is no bursitis.  Rest of the elbow exam is unremarkable. Specialty Comments:  No specialty comments available.  Imaging: Xr Elbow Complete Right (3+view)  Result Date: 07/20/2018 Large osteophyte emanating posteriorly from the olecranon    PMFS History: Patient Active Problem List   Diagnosis Date Noted  . Hypertension 05/25/2018  . Chronic back pain 05/25/2018  . Gross hematuria 05/25/2018  . Elbow pain, chronic, right 05/25/2018  . Hyperlipidemia 05/25/2018  . Encounter for long-term (current) use of medications 05/25/2018  . Nocturia 05/25/2018   Past Medical History:  Diagnosis Date  . Allergic rhinitis   . Chronic low back pain   .  Hypercholesteremia   . Hypertension     Family History  Problem Relation Age of Onset  . Hypertension Mother   . Hypertension Father     No past surgical history on file. Social History   Occupational History  . Not on file  Tobacco Use  . Smoking status: Never Smoker  . Smokeless tobacco: Never Used  Substance and Sexual Activity  . Alcohol use: No  . Drug use: No  . Sexual activity: Not on file

## 2018-07-24 MED FILL — ATORVASTATIN 20 MG TABLET: 20 | 30 days supply | Qty: 30 | Fill #2

## 2018-07-24 MED FILL — AMLODIPINE BESYLATE 5 MG TA: 5 | 30 days supply | Qty: 30 | Fill #2

## 2018-07-24 MED FILL — IBUPROFEN 600 MG TABLET: 600 | 10 days supply | Qty: 30 | Fill #2

## 2018-07-24 MED FILL — KETOCONAZOLE 2 % SHAM: 2 | 30 days supply | Qty: 120 | Fill #1

## 2018-07-24 MED FILL — traMADol HCL 50 MG TABS: 50 | 30 days supply | Qty: 60 | Fill #2

## 2018-07-24 MED FILL — GABAPENTIN 100 MG CAPSULE: 100 | 30 days supply | Qty: 60 | Fill #0

## 2018-07-25 ENCOUNTER — Ambulatory Visit: Payer: Medicaid Other | Admitting: Urology

## 2018-08-21 MED FILL — ATORVASTATIN 20 MG TABLET: 20 | 30 days supply | Qty: 30 | Fill #3

## 2018-08-21 MED FILL — IBUPROFEN 600 MG TABLET: 600 | 10 days supply | Qty: 30 | Fill #3

## 2018-08-21 MED FILL — GABAPENTIN 100 MG CAP: 100 | 30 days supply | Qty: 60 | Fill #1

## 2018-08-21 MED FILL — traMADol HCL 50 MG TABS: 50 | 30 days supply | Qty: 60 | Fill #3

## 2018-08-21 MED FILL — AMLODIPINE BESYLATE 5 MG TA: 5 | 30 days supply | Qty: 30 | Fill #3

## 2018-08-27 ENCOUNTER — Ambulatory Visit: Payer: Medicaid Other | Admitting: Family Medicine

## 2018-09-04 ENCOUNTER — Ambulatory Visit: Payer: Self-pay | Attending: Family Medicine | Admitting: Internal Medicine

## 2018-09-04 ENCOUNTER — Other Ambulatory Visit: Payer: Self-pay

## 2018-09-04 ENCOUNTER — Encounter: Payer: Self-pay | Admitting: Internal Medicine

## 2018-09-04 ENCOUNTER — Other Ambulatory Visit: Payer: Self-pay | Admitting: *Deleted

## 2018-09-04 VITALS — BP 127/94 | HR 80 | Temp 98.5°F | Ht 65.0 in | Wt 178.8 lb

## 2018-09-04 DIAGNOSIS — Z79899 Other long term (current) drug therapy: Secondary | ICD-10-CM

## 2018-09-04 DIAGNOSIS — N3001 Acute cystitis with hematuria: Secondary | ICD-10-CM

## 2018-09-04 DIAGNOSIS — L819 Disorder of pigmentation, unspecified: Secondary | ICD-10-CM

## 2018-09-04 DIAGNOSIS — G8929 Other chronic pain: Secondary | ICD-10-CM

## 2018-09-04 DIAGNOSIS — E782 Mixed hyperlipidemia: Secondary | ICD-10-CM

## 2018-09-04 DIAGNOSIS — M25521 Pain in right elbow: Secondary | ICD-10-CM

## 2018-09-04 DIAGNOSIS — R739 Hyperglycemia, unspecified: Secondary | ICD-10-CM

## 2018-09-04 DIAGNOSIS — I10 Essential (primary) hypertension: Secondary | ICD-10-CM

## 2018-09-04 MED ORDER — SULFAMETHOXAZOLE-TRIMETHOPRIM 800-160 MG PO TABS: 1.0000 | ORAL_TABLET | Freq: Two times a day (BID) | ORAL | 0 refills | Status: DC

## 2018-09-04 MED FILL — SULFAMETHOXAZOLE-TMP DS TAB: 800-160 | 7 days supply | Qty: 14 | Fill #0

## 2018-09-04 NOTE — Patient Instructions (Signed)

## 2018-09-04 NOTE — Addendum Note (Signed)
Addended by: Paschal Dopp on: 09/04/2018 10:39 AM   Modules accepted: Orders

## 2018-09-04 NOTE — Progress Notes (Signed)
Patient arrived ambulatory, alert and orientated to clinic for a three (3) month follow up. Patient states he has several concerns. Patient states he had an xray of his right elbow which "showed I have extra bone."  Patient states his elbow is painful with a pain score of 8/10.  Patient states he feels he needs his kidney levels check and endorses lower back pain, bilaterally. Patient states he also has white spots arriving on his skin which he doesn't have any idea why they appeared. Patients medication list was reviewed and reconciled.  Patients diastolic pressure was slightly elevated.  Patient also endorses occasional bilateral leg numbness without pain.

## 2018-09-04 NOTE — Progress Notes (Signed)
F/u htn, lipids. Seen by ortho for right elbow pain, diagnosed w osteophyte, advised surgery.  Has not taken bp med today. Taking atorvastatin as prescribed, denies myalgias, fatigue. Exercising regularly, treadmill, strength training, no c/p, no sob. Also concerned about white spots on skin, not itchy.  Current Outpatient Medications on File Prior to Visit  Medication Sig Dispense Refill  . amLODipine (NORVASC) 5 MG tablet Take 1 tablet (5 mg total) by mouth daily. To lower blood pressure 30 tablet 4  . atorvastatin (LIPITOR) 20 MG tablet Take 1 tablet (20 mg total) by mouth daily. In the evenings to lower cholesterol 30 tablet 5  . cetirizine (ZYRTEC) 10 MG tablet Take 1 tablet (10 mg total) by mouth daily. At bedtime for nasal congestion 30 tablet 11  . chlorpheniramine-HYDROcodone (TUSSIONEX PENNKINETIC ER) 10-8 MG/5ML LQCR Take 5 mLs by mouth every 12 (twelve) hours as needed. 80 mL 0  . cyclobenzaprine (FLEXERIL) 10 MG tablet Take 1 tablet (10 mg total) by mouth at bedtime. For muscle spasms. 30 tablet 3  . gabapentin (NEURONTIN) 100 MG capsule Take 1-2 capsules at bedtime for nerve type pain 60 capsule 3  . ibuprofen (ADVIL,MOTRIN) 600 MG tablet Take 1 tablet (600 mg total) by mouth every 8 (eight) hours as needed. 30 tablet 3  . ketoconazole (NIZORAL) 2 % shampoo Apply 1 application topically 2 (two) times a week. Shampoo hair twice per week as needed 120 mL 4  . loratadine (CLARITIN) 10 MG tablet Take 10 mg by mouth daily.      . montelukast (SINGULAIR) 10 MG tablet Take 10 mg by mouth at bedtime.      . naproxen (NAPROSYN) 500 MG tablet Take 500 mg by mouth 2 (two) times daily with a meal.      . triamcinolone (NASACORT) 55 MCG/ACT nasal inhaler Place 2 sprays into the nose daily. As needed for allergy symptom relief 1 Inhaler 11  . amoxicillin (AMOXIL) 500 MG capsule Take 1 capsule (500 mg total) by mouth 3 (three) times daily. (Patient not taking: Reported on 09/04/2018) 30 capsule 0   . diclofenac sodium (VOLTAREN) 1 % GEL Apply 2 g up to 4 times daily to the elbow as needed for pain (Patient not taking: Reported on 09/04/2018) 1 Tube 6  . omeprazole (PRILOSEC) 20 MG capsule Take 1 capsule (20 mg total) by mouth daily. (Patient not taking: Reported on 09/04/2018) 30 capsule 3  . oxyCODONE-acetaminophen (PERCOCET/ROXICET) 5-325 MG per tablet Take 1 tablet by mouth every 4 (four) hours as needed for pain. (Patient not taking: Reported on 09/04/2018) 30 tablet 0  . predniSONE (DELTASONE) 20 MG tablet Take 2 pills once daily for 2 days then 1 pill daily for 2 days then 1 pill daily for 4 days (Patient not taking: Reported on 09/04/2018) 8 tablet 0  . sulfamethoxazole-trimethoprim (BACTRIM DS,SEPTRA DS) 800-160 MG tablet Take 1 tablet by mouth 2 (two) times daily. For treatment of urinary tract infection (Patient not taking: Reported on 09/04/2018) 14 tablet 0   No current facility-administered medications on file prior to visit.      Patient Active Problem List   Diagnosis Date Noted  . Hypertension 05/25/2018  . Chronic back pain 05/25/2018  . Gross hematuria 05/25/2018  . Elbow pain, chronic, right 05/25/2018  . Hyperlipidemia 05/25/2018  . Encounter for long-term (current) use of medications 05/25/2018  . Nocturia 05/25/2018    Past Medical History:  Diagnosis Date  . Allergic rhinitis   . Chronic low  back pain   . Hypercholesteremia   . Hypertension    History reviewed. No pertinent surgical history.  Male in NAD BP (!) 127/94 (BP Location: Left Arm, Patient Position: Sitting, Cuff Size: Normal)   Pulse 80   Temp 98.5 F (36.9 C)   Ht 5\' 5"  (1.651 m)   Wt 81.1 kg   SpO2 98%   BMI 29.75 kg/m   Cv: rrr Lungs: cta Skin: scattered hypopigmented macules on extremeties A/p  1. Essential hypertension bp elevated due to noncompliance w meds, continue same meds, rv for bp check in 1 mos  2. Mixed hyperlipidemia Continue atorvastatin, diet, plan lipids in few  months  3. Elevated blood sugar Diet, exercise, wt loss efforts, await hba1c  4. Elbow pain, chronic, right Due to osteophyte, tx per ortho  5. Hypopigmentation prob post-inflammatory, monitor

## 2018-09-05 LAB — HEPATIC FUNCTION PANEL
ALT: 21 IU/L (ref 0–44)
AST: 23 IU/L (ref 0–40)
Albumin: 4.4 g/dL (ref 3.8–4.9)
Alkaline Phosphatase: 91 IU/L (ref 39–117)
Bilirubin Total: 0.9 mg/dL (ref 0.0–1.2)
Bilirubin, Direct: 0.16 mg/dL (ref 0.00–0.40)
Total Protein: 7.1 g/dL (ref 6.0–8.5)

## 2018-09-05 LAB — HEMOGLOBIN A1C
Est. average glucose Bld gHb Est-mCnc: 117 mg/dL
Hgb A1c MFr Bld: 5.7 % — ABNORMAL HIGH (ref 4.8–5.6)

## 2018-09-18 MED FILL — IBUPROFEN 600 MG TABLET: 600 | 10 days supply | Qty: 30 | Fill #1

## 2018-09-19 ENCOUNTER — Telehealth: Payer: Self-pay | Admitting: *Deleted

## 2018-09-19 MED FILL — AMLODIPINE BESYLATE 5 MG TA: 5 | 30 days supply | Qty: 30 | Fill #4

## 2018-09-19 NOTE — Telephone Encounter (Signed)
Thank you! That was the main one he wanted. He says he needs the BP refilled. It looks like he should have 1 more at the pharmacy if their were 5 refills but he says pharmacy says yesterday there were none per the pharmacy.

## 2018-09-19 NOTE — Telephone Encounter (Signed)
Thank you! Patient will pick up tomorrow. 

## 2018-09-19 NOTE — Telephone Encounter (Signed)
Patient verified DOB Patient Is aware of labs being normal. Patient is requesting a refill on his medications.

## 2018-09-19 NOTE — Telephone Encounter (Signed)
He does have one refill left of amlodipine, I went ahead and submitted it so the pharmacy can start getting it ready for him.

## 2018-11-06 ENCOUNTER — Other Ambulatory Visit: Payer: Self-pay | Admitting: Family Medicine

## 2018-11-06 ENCOUNTER — Telehealth: Payer: Self-pay | Admitting: Family Medicine

## 2018-11-06 DIAGNOSIS — M545 Low back pain, unspecified: Secondary | ICD-10-CM

## 2018-11-06 DIAGNOSIS — M5440 Lumbago with sciatica, unspecified side: Principal | ICD-10-CM

## 2018-11-06 MED FILL — GABAPENTIN 100 MG CAP: 100 | 30 days supply | Qty: 60 | Fill #2

## 2018-11-06 MED FILL — KETOCONAZOLE 2% SHAMPOO: 2 | 30 days supply | Qty: 120 | Fill #2

## 2018-11-06 MED FILL — ?AMLODIPINE BESYLATE 5MG TA: 5 | 90 days supply | Qty: 90 | Fill #1

## 2018-11-06 MED FILL — ?IBUPROFEN 600 MG TABS: 600 | 10 days supply | Qty: 30 | Fill #2

## 2018-11-06 MED FILL — ?ATORVASTATIN 20 MG TABLET: 20 | 60 days supply | Qty: 60 | Fill #4

## 2018-11-06 NOTE — Telephone Encounter (Signed)
1) Medication(s) Requested (by name): ° °2) Pharmacy of Choice: ° °3) Special Requests: ° ° °Approved medications will be sent to the pharmacy, we will reach out if there is an issue. ° °Requests made after 3pm may not be addressed until the following business day! ° °If a patient is unsure of the name of the medication(s) please note and ask patient to call back when they are able to provide all info, do not send to responsible party until all information is available! ° °

## 2018-11-07 ENCOUNTER — Encounter: Payer: Self-pay | Admitting: Family Medicine

## 2018-11-07 ENCOUNTER — Other Ambulatory Visit: Payer: Self-pay

## 2018-11-07 ENCOUNTER — Ambulatory Visit: Payer: Self-pay | Attending: Family Medicine | Admitting: Family Medicine

## 2018-11-07 DIAGNOSIS — G8929 Other chronic pain: Secondary | ICD-10-CM

## 2018-11-07 DIAGNOSIS — L299 Pruritus, unspecified: Secondary | ICD-10-CM

## 2018-11-07 DIAGNOSIS — F418 Other specified anxiety disorders: Secondary | ICD-10-CM

## 2018-11-07 DIAGNOSIS — J309 Allergic rhinitis, unspecified: Secondary | ICD-10-CM

## 2018-11-07 DIAGNOSIS — G479 Sleep disorder, unspecified: Secondary | ICD-10-CM

## 2018-11-07 DIAGNOSIS — M5440 Lumbago with sciatica, unspecified side: Secondary | ICD-10-CM

## 2018-11-07 DIAGNOSIS — M545 Low back pain, unspecified: Secondary | ICD-10-CM

## 2018-11-07 DIAGNOSIS — M25521 Pain in right elbow: Secondary | ICD-10-CM

## 2018-11-07 DIAGNOSIS — R319 Hematuria, unspecified: Secondary | ICD-10-CM

## 2018-11-07 DIAGNOSIS — R4589 Other symptoms and signs involving emotional state: Secondary | ICD-10-CM

## 2018-11-07 MED ORDER — TRAMADOL HCL 50 MG PO TABS: 50.0000 mg | ORAL_TABLET | Freq: Three times a day (TID) | ORAL | 4 refills | Status: DC | PRN

## 2018-11-07 MED ORDER — CYCLOBENZAPRINE HCL 10 MG PO TABS: 10.0000 mg | ORAL_TABLET | Freq: Every day | ORAL | 3 refills | Status: DC

## 2018-11-07 MED ORDER — FLUTICASONE PROPIONATE 50 MCG/ACT NA SUSP: 2.0000 | Freq: Every day | NASAL | 6 refills | Status: DC

## 2018-11-07 MED ORDER — GABAPENTIN 300 MG PO CAPS: ORAL_CAPSULE | ORAL | 3 refills | Status: DC

## 2018-11-07 MED ORDER — KETOCONAZOLE 2 % EX SHAM: 1.0000 "application " | MEDICATED_SHAMPOO | CUTANEOUS | 11 refills | Status: DC

## 2018-11-07 MED FILL — CYCLOBENZAPRINE 10 MG TAB: 10 | 30 days supply | Qty: 30 | Fill #0

## 2018-11-07 MED FILL — FLUTICASONE PROP 50 MCG SPR: 50 | 16 days supply | Qty: 16 | Fill #0

## 2018-11-07 MED FILL — traMADol HCL 50 MG TABS: 50 | 30 days supply | Qty: 90 | Fill #0

## 2018-11-07 NOTE — Progress Notes (Signed)
Per pt he have an extra bone on his elbow and when he went to the doctor to get it removed, they told him it was going to cost him $6,000 and he can not afford it. Patient would like to talk to   Med refills
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
## 2018-12-06 MED FILL — predniSONE 20 MG TABS: 20 | 7 days supply | Qty: 10 | Fill #0

## 2018-12-06 MED FILL — AZITHROMYCIN 250 MG TABLET: 250 | 5 days supply | Qty: 6 | Fill #0

## 2019-01-15 ENCOUNTER — Telehealth: Payer: Self-pay | Admitting: Licensed Clinical Social Worker

## 2019-01-15 NOTE — Telephone Encounter (Signed)
Call placed to patient per PCP consult. LCSW left message requesting a return call.

## 2019-02-06 MED FILL — ?CETIRIZINE HCL 10 MG TABLE: 10 | 30 days supply | Qty: 30 | Fill #0

## 2019-02-07 ENCOUNTER — Other Ambulatory Visit: Payer: Self-pay | Admitting: Family Medicine

## 2019-02-07 DIAGNOSIS — Z79899 Other long term (current) drug therapy: Secondary | ICD-10-CM

## 2019-02-07 MED FILL — FLUTICASONE PROP 50 MCG SPR: 50 | 16 days supply | Qty: 16 | Fill #1

## 2019-02-07 MED FILL — ?ATORVASTATIN 20 MG TABLET: 20 | 30 days supply | Qty: 30 | Fill #0

## 2019-02-07 MED FILL — traMADol HCL 50 MG TABS: 50 | 30 days supply | Qty: 90 | Fill #1

## 2019-02-07 MED FILL — ?OMEPRAZOLE 20 MG CAPSULE D: 20 | 30 days supply | Qty: 30 | Fill #1

## 2019-02-07 MED FILL — GABAPENTIN 300 MG CAPSULE: 300 | 30 days supply | Qty: 30 | Fill #0

## 2019-02-08 MED FILL — ?AMLODIPINE BESYLATE 5MG TA: 5 | 30 days supply | Qty: 30 | Fill #1

## 2019-03-07 ENCOUNTER — Other Ambulatory Visit: Payer: Self-pay | Admitting: Family Medicine

## 2019-03-07 DIAGNOSIS — Z79899 Other long term (current) drug therapy: Secondary | ICD-10-CM

## 2019-03-07 MED FILL — ?AMLODIPINE BESYLATE 5MG TA: 5 | 30 days supply | Qty: 30 | Fill #2

## 2019-03-07 MED FILL — ?CETIRIZINE HCL 10 MG TABLE: 10 | 30 days supply | Qty: 30 | Fill #1

## 2019-03-07 MED FILL — ?IBUPROFEN 600 MG TABS: 600 | 10 days supply | Qty: 30 | Fill #3

## 2019-03-07 MED FILL — KETOCONAZOLE 2% SHAMPOO: 2 | 12 days supply | Qty: 120 | Fill #0

## 2019-03-07 MED FILL — GABAPENTIN 300 MG CAPSULE: 300 | 30 days supply | Qty: 30 | Fill #1

## 2019-03-07 MED FILL — traMADol HCL 50 MG TABS: 50 | 30 days supply | Qty: 90 | Fill #2

## 2019-04-22 ENCOUNTER — Other Ambulatory Visit: Payer: Self-pay | Admitting: Family Medicine

## 2019-04-22 DIAGNOSIS — M545 Low back pain, unspecified: Secondary | ICD-10-CM

## 2019-04-22 DIAGNOSIS — Z79899 Other long term (current) drug therapy: Secondary | ICD-10-CM

## 2019-04-22 MED FILL — GABAPENTIN 300 MG CAPSULE: 300 | 30 days supply | Qty: 30 | Fill #2

## 2019-04-22 MED FILL — ?ATORVASTATIN 20 MG TABLET: 20 | 30 days supply | Qty: 30 | Fill #0

## 2019-04-22 MED FILL — traMADol HCL 50 MG TABS: 50 | 30 days supply | Qty: 90 | Fill #3

## 2019-04-22 MED FILL — KETOCONAZOLE 2% SHAMPOO: 2 | 12 days supply | Qty: 120 | Fill #1

## 2019-04-22 MED FILL — IBUPROFEN 600 MG TABLET: 600 | 10 days supply | Qty: 30 | Fill #0

## 2019-04-22 MED FILL — ?CETIRIZINE HCL 10 MG TABLE: 10 | 30 days supply | Qty: 30 | Fill #2

## 2019-04-22 MED FILL — ?AMLODIPINE BESYLATE 5MG TA: 5 | 30 days supply | Qty: 30 | Fill #3

## 2019-05-07 MED FILL — KETOCONAZOLE 2% SHAMPOO: 2 | 12 days supply | Qty: 120 | Fill #1

## 2019-07-09 MED FILL — ?CETIRIZINE HCL 10 MG TABLE: 10 | 30 days supply | Qty: 30 | Fill #3

## 2019-07-09 MED FILL — IBUPROFEN 600 MG TABLET: 600 | 30 days supply | Qty: 90 | Fill #1

## 2019-08-01 ENCOUNTER — Ambulatory Visit: Payer: Self-pay | Attending: Family Medicine | Admitting: Family Medicine

## 2019-08-01 ENCOUNTER — Other Ambulatory Visit: Payer: Self-pay

## 2019-08-01 ENCOUNTER — Encounter: Payer: Self-pay | Admitting: Family Medicine

## 2019-08-01 ENCOUNTER — Ambulatory Visit: Payer: Self-pay

## 2019-08-01 VITALS — BP 154/94 | HR 86 | Temp 98.4°F | Ht 65.0 in | Wt 181.0 lb

## 2019-08-01 DIAGNOSIS — E785 Hyperlipidemia, unspecified: Secondary | ICD-10-CM

## 2019-08-01 DIAGNOSIS — Z79899 Other long term (current) drug therapy: Secondary | ICD-10-CM

## 2019-08-01 DIAGNOSIS — M545 Low back pain, unspecified: Secondary | ICD-10-CM

## 2019-08-01 DIAGNOSIS — L299 Pruritus, unspecified: Secondary | ICD-10-CM

## 2019-08-01 DIAGNOSIS — I10 Essential (primary) hypertension: Secondary | ICD-10-CM

## 2019-08-01 DIAGNOSIS — R7303 Prediabetes: Secondary | ICD-10-CM

## 2019-08-01 DIAGNOSIS — J309 Allergic rhinitis, unspecified: Secondary | ICD-10-CM

## 2019-08-01 LAB — GLUCOSE, POCT (MANUAL RESULT ENTRY): POC Glucose: 111 mg/dL — AB (ref 70–99)

## 2019-08-01 LAB — POCT GLYCOSYLATED HEMOGLOBIN (HGB A1C): Hemoglobin A1C: 5.8 % — AB (ref 4.0–5.6)

## 2019-08-01 MED ORDER — IBUPROFEN 600 MG PO TABS: 600.0000 mg | ORAL_TABLET | Freq: Three times a day (TID) | ORAL | 3 refills | Status: DC | PRN

## 2019-08-01 MED ORDER — TRAMADOL HCL 50 MG PO TABS: 50.0000 mg | ORAL_TABLET | Freq: Three times a day (TID) | ORAL | 1 refills | Status: DC | PRN

## 2019-08-01 MED ORDER — GABAPENTIN 300 MG PO CAPS: ORAL_CAPSULE | ORAL | 3 refills | Status: DC

## 2019-08-01 MED ORDER — CETIRIZINE HCL 10 MG PO TABS: 10.0000 mg | ORAL_TABLET | Freq: Every day | ORAL | 11 refills | Status: DC

## 2019-08-01 MED ORDER — KETOCONAZOLE 2 % EX SHAM: 1.0000 "application " | MEDICATED_SHAMPOO | CUTANEOUS | 11 refills | Status: AC

## 2019-08-01 MED ORDER — ATORVASTATIN CALCIUM 20 MG PO TABS: 20.0000 mg | ORAL_TABLET | Freq: Every day | ORAL | 3 refills | Status: DC

## 2019-08-01 MED ORDER — CYCLOBENZAPRINE HCL 10 MG PO TABS: 10.0000 mg | ORAL_TABLET | Freq: Every day | ORAL | 3 refills | Status: DC

## 2019-08-01 MED ORDER — AMLODIPINE BESYLATE 5 MG PO TABS: 5.0000 mg | ORAL_TABLET | Freq: Every day | ORAL | 4 refills | Status: DC

## 2019-08-01 MED FILL — AMLODIPINE BESYLATE 5 MG TA: 5 | 30 days supply | Qty: 30 | Fill #0

## 2019-08-01 MED FILL — KETOCONAZOLE 2 % SHAM: 2 | 30 days supply | Qty: 120 | Fill #0

## 2019-08-01 MED FILL — CYCLOBENZAPRINE 10 MG TAB: 10 | 30 days supply | Qty: 30 | Fill #0

## 2019-08-01 MED FILL — ?IBUPROFEN 600 MG TABLETS: 600 | 20 days supply | Qty: 60 | Fill #0

## 2019-08-01 MED FILL — ?ATORVASTATIN 20 MG TABLET: 20 | 30 days supply | Qty: 30 | Fill #0

## 2019-08-01 MED FILL — ?CETIRIZINE HCL 10 MG TABLE: 10 | 30 days supply | Qty: 30 | Fill #0

## 2019-08-01 MED FILL — GABAPENTIN 300 MG CAPSULE: 300 | 30 days supply | Qty: 30 | Fill #0

## 2019-08-01 MED FILL — traMADol HCL 50 MG TABS: 50 | 30 days supply | Qty: 90 | Fill #0

## 2019-08-01 NOTE — Progress Notes (Signed)
Established Patient Office Visit  Subjective:  Patient ID: Howard RobertsonSayid A Claar, male    DOB: 08/30/1960  Age: 59 y.o. MRN: [id number]  CC:  Chief Complaint  Patient presents with  . Medication Refill    HPI Howard Rice, 59 year old male, who was last seen for telemedicine visit on 11/07/2018 due to the complaint of chronic right elbow pain.  He was seen previously on 09/04/2018 by another provider in follow-up of chronic medical issues including hypertension, hyperlipidemia and for hemoglobin A1c due to elevated blood sugars on prior blood work.  At today's visit he is seen in follow-up of chronic issues including hypertension, prediabetes, chronic low back pain with radiation, allergic rhinitis and he request a refill of medication previously prescribed for an itchy scalp.  He reports that overall he feels well at today's visit other than the chronic issues with low back pain.  He needs refill of his amlodipine as he is currently out of the medication.  He reports that his blood pressure is controlled when he is on the medication.  He denies headaches or dizziness related to his blood pressure.  He denies any increased muscle or joint pain related to his use of atorvastatin.  He has had no increased thirst or urinary frequency and no issues with blurred vision related to his prediabetes.  He continues to have chronic low back pain which radiates to the left leg.  He would like to have refill of ibuprofen for less severe pain and tramadol to take as needed for more severe pain.  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  He also would like to have refill of medication for nerve pain and muscle spasm.  He also needs refill of medication to help with nasal congestion, sneezing and postnasal drainage.  He additionally continues to have issues with an itchy scalp and needs refill of previous prescription shampoo.  Past Medical History:  Diagnosis Date  . Allergic rhinitis   .  Chronic low back pain   . Hypercholesteremia   . Hypertension     History reviewed. No pertinent surgical history.  Family History  Problem Relation Age of Onset  . Hypertension Mother   . Hypertension Father     Social History   Socioeconomic History  . Marital status: Married    Spouse name: Not on file  . Number of children: Not on file  . Years of education: Not on file  . Highest education level: Not on file  Occupational History  . Not on file  Tobacco Use  . Smoking status: Never Smoker  . Smokeless tobacco: Never Used  Substance and Sexual Activity  . Alcohol use: No  . Drug use: No  . Sexual activity: Not on file  Other Topics Concern  . Not on file  Social History Narrative  . Not on file   Social Determinants of Health   Financial Resource Strain:   . Difficulty of Paying Living Expenses: Not on file  Food Insecurity:   . Worried About Programme researcher, broadcasting/film/videounning Out of Food in the Last Year: Not on file  . Ran Out of Food in the Last Year: Not on file  Transportation Needs:   . Lack of Transportation (Medical): Not on file  . Lack of Transportation (Non-Medical): Not on file  Physical Activity:   . Days of Exercise per Week: Not on file  . Minutes of Exercise per Session: Not on file  Stress:   . Feeling of Stress : Not on file  Social Connections:   . Frequency of Communication with Friends and Family: Not on file  . Frequency of Social Gatherings with Friends and Family: Not on file  . Attends Religious Services: Not on file  . Active Member of Clubs or Organizations: Not on file  . Attends Banker Meetings: Not on file  . Marital Status: Not on file  Intimate Partner Violence:   . Fear of Current or Ex-Partner: Not on file  . Emotionally Abused: Not on file  . Physically Abused: Not on file  . Sexually Abused: Not on file    Outpatient Medications Prior to Visit  Medication Sig Dispense Refill  . loratadine (CLARITIN) 10 MG tablet Take 10 mg  by mouth daily.      . montelukast (SINGULAIR) 10 MG tablet Take 10 mg by mouth at bedtime.      Marland Kitchen omeprazole (PRILOSEC) 20 MG capsule Take 1 capsule (20 mg total) by mouth daily. 30 capsule 3  . triamcinolone (NASACORT) 55 MCG/ACT nasal inhaler Place 2 sprays into the nose daily. As needed for allergy symptom relief 1 Inhaler 11  . amLODipine (NORVASC) 5 MG tablet Take 1 tablet (5 mg total) by mouth daily. To lower blood pressure 30 tablet 4  . atorvastatin (LIPITOR) 20 MG tablet TAKE 1 TABLET (20 MG TOTAL) BY MOUTH DAILY IN THE EVENINGS TO LOWER CHOLESTEROL. MUST MAKE APPT FOR FURTHER REFILLS 30 tablet 0  . cetirizine (ZYRTEC) 10 MG tablet Take 1 tablet (10 mg total) by mouth daily. At bedtime for nasal congestion 30 tablet 11  . gabapentin (NEURONTIN) 300 MG capsule Take 1 capsules at bedtime for nerve type pain/numbness in feet 90 capsule 3  . ibuprofen (ADVIL) 600 MG tablet TAKE 1 TABLET (600 MG TOTAL) BY MOUTH EVERY 8 (EIGHT) HOURS AS NEEDED. 30 tablet 3  . ketoconazole (NIZORAL) 2 % shampoo Apply 1 application topically 2 (two) times a week. Shampoo hair twice per week as needed 120 mL 11  . naproxen (NAPROSYN) 500 MG tablet Take 500 mg by mouth 2 (two) times daily with a meal.      . sulfamethoxazole-trimethoprim (BACTRIM DS,SEPTRA DS) 800-160 MG tablet Take 1 tablet by mouth 2 (two) times daily. For treatment of urinary tract infection 14 tablet 0  . traMADol (ULTRAM) 50 MG tablet Take 1 tablet (50 mg total) by mouth 3 (three) times daily as needed for moderate pain. 30 day supply 90 tablet 4  . amoxicillin (AMOXIL) 500 MG capsule Take 1 capsule (500 mg total) by mouth 3 (three) times daily. (Patient not taking: Reported on 09/04/2018) 30 capsule 0  . chlorpheniramine-HYDROcodone (TUSSIONEX PENNKINETIC ER) 10-8 MG/5ML LQCR Take 5 mLs by mouth every 12 (twelve) hours as needed. (Patient not taking: Reported on 11/07/2018) 80 mL 0  . diclofenac sodium (VOLTAREN) 1 % GEL Apply 2 g up to 4 times  daily to the elbow as needed for pain (Patient not taking: Reported on 08/01/2019) 1 Tube 6  . fluticasone (FLONASE) 50 MCG/ACT nasal spray Place 2 sprays into both nostrils daily. (Patient not taking: Reported on 08/01/2019) 16 g 6  . oxyCODONE-acetaminophen (PERCOCET/ROXICET) 5-325 MG per tablet Take 1 tablet by mouth every 4 (four) hours as needed for pain. (Patient not taking: Reported on 11/07/2018) 30 tablet 0  . cyclobenzaprine (FLEXERIL) 10 MG tablet Take 1 tablet (10 mg total) by mouth at bedtime. For muscle spasms. (Patient not taking:  Reported on 08/01/2019) 90 tablet 3  . predniSONE (DELTASONE) 20 MG tablet Take 2 pills once daily for 2 days then 1 pill daily for 2 days then 1 pill daily for 4 days (Patient not taking: Reported on 09/04/2018) 8 tablet 0   No facility-administered medications prior to visit.    No Known Allergies  ROS Review of Systems  Constitutional: Positive for fatigue (Occasional). Negative for chills and fever.  HENT: Positive for congestion, postnasal drip, rhinorrhea and sneezing (Occasional). Negative for sore throat and trouble swallowing.        Symptoms related to allergic rhinitis controlled with use of cetirizine  Eyes: Negative for photophobia and visual disturbance.  Respiratory: Negative for cough and shortness of breath.   Gastrointestinal: Negative for abdominal pain, blood in stool, constipation, diarrhea and nausea.  Endocrine: Negative for polydipsia, polyphagia and polyuria.  Genitourinary: Negative for dysuria and frequency.  Musculoskeletal: Positive for arthralgias and back pain.  Neurological: Negative for dizziness and headaches.  Hematological: Negative for adenopathy. Does not bruise/bleed easily.  Psychiatric/Behavioral: Negative for self-injury and suicidal ideas.      Objective:    Physical Exam  Constitutional: He is oriented to person, place, and time. He appears well-developed and well-nourished.  Well-nourished well-developed  overweight for height/obese male in no acute distress.  He is wearing mask as per office COVID-19 protocol  Neck: No JVD present. No thyromegaly present.  Cardiovascular: Normal rate and regular rhythm.  Pulmonary/Chest: Effort normal and breath sounds normal.  Abdominal: Soft. There is no abdominal tenderness. There is no rebound and no guarding.  Musculoskeletal:        General: Tenderness present. No edema.     Cervical back: Normal range of motion and neck supple.     Comments: Lumbosacral tenderness to palpation, thoracolumbar paraspinous spasm moderate, positive seated left leg raise  Lymphadenopathy:    He has no cervical adenopathy.  Neurological: He is alert and oriented to person, place, and time.  Skin: Skin is warm and dry.  Psychiatric: He has a normal mood and affect. His behavior is normal.  Nursing note and vitals reviewed.   BP (!) 154/94 (BP Location: Left Arm, Patient Position: Sitting, Cuff Size: Normal)   Pulse 86   Temp 98.4 F (36.9 C) (Oral)   Ht 5\' 5"  (1.651 m)   Wt 181 lb (82.1 kg)   SpO2 95%   BMI 30.12 kg/m  Wt Readings from Last 3 Encounters:  08/01/19 181 lb (82.1 kg)  09/04/18 178 lb 12.8 oz (81.1 kg)  05/25/18 183 lb 6.4 oz (83.2 kg)     Health Maintenance Due  Topic Date Due  . Hepatitis C Screening  1961-05-05  . HIV Screening  07/11/1973  . COLONOSCOPY  07/11/2008  . INFLUENZA VACCINE  02/09/2019    Declined influenza immunization  No results found for: TSH Lab Results  Component Value Date   WBC 9.0 11/02/2017   HGB 16.1 11/02/2017   HCT 46.9 11/02/2017   MCV 87.5 11/02/2017   PLT 196 11/02/2017   Lab Results  Component Value Date   NA 140 05/25/2018   K 4.2 05/25/2018   CO2 24 05/25/2018   GLUCOSE 136 (H) 05/25/2018   BUN 17 05/25/2018   CREATININE 1.17 05/25/2018   BILITOT 0.9 09/04/2018   ALKPHOS 91 09/04/2018   AST 23 09/04/2018   ALT 21 09/04/2018   PROT 7.1 09/04/2018   ALBUMIN 4.4 09/04/2018   CALCIUM 9.4  05/25/2018  ANIONGAP 11 11/02/2017   Lab Results  Component Value Date   CHOL 271 (H) 05/25/2018   Lab Results  Component Value Date   HDL 42 05/25/2018   Lab Results  Component Value Date   LDLCALC 184 (H) 05/25/2018   Lab Results  Component Value Date   TRIG 223 (H) 05/25/2018   Lab Results  Component Value Date   CHOLHDL 6.5 (H) 05/25/2018   Lab Results  Component Value Date   HGBA1C 5.8 (A) 08/01/2019      Assessment & Plan:  1. Prediabetes Random glucose at today's visit was 111 and patient with hemoglobin A1c of 5.8 which is still consistent with diagnosis of prediabetes.  He is encouraged to follow low carbohydrate/no concentrated sweets diet as well as to engage in regular exercise with goal of weight loss.  He declines Metformin at today's visit.   - Glucose (CBG) - HgB A1c  2. Essential hypertension Refill provided of amlodipine 5 mg and stressed importance of daily compliance.  Blood pressure elevated at today's visit but he reports that he is out of medication.  Low-sodium diet and exercise with goal of weight loss encouraged.  Will recheck blood pressure and obtain blood work in approximately 4 weeks when patient returns for reevaluation. - amLODipine (NORVASC) 5 MG tablet; Take 1 tablet (5 mg total) by mouth daily. To lower blood pressure  Dispense: 30 tablet; Refill: 4  3. Allergic rhinitis, unspecified seasonality, unspecified trigger Refill provided of cetirizine for continued treatment of allergic rhinitis/nasal congestion and postnasal drainage. - cetirizine (ZYRTEC) 10 MG tablet; Take 1 tablet (10 mg total) by mouth daily. At bedtime for nasal congestion  Dispense: 30 tablet; Refill: 11  4. Low back pain with radiation Patient with chronic low back pain with left-sided radiation.  Refill provided of gabapentin to help with radicular/neuropathic pain as well as prescriptions for ibuprofen and Flexeril to help with back pain.  Patient request tramadol to  take for more severe pain and this was provided at today's visit.  If patient wishes to continue the medication on a daily basis, he will need a chronic narcotic/controlled substance contract and will likely be referred to pain management for further evaluation and treatment. - gabapentin (NEURONTIN) 300 MG capsule; Take 1 capsules at bedtime for nerve type pain/numbness in feet  Dispense: 90 capsule; Refill: 3 - ibuprofen (ADVIL) 600 MG tablet; Take 1 tablet (600 mg total) by mouth every 8 (eight) hours as needed.  Dispense: 60 tablet; Refill: 3 - cyclobenzaprine (FLEXERIL) 10 MG tablet; Take 1 tablet (10 mg total) by mouth at bedtime. For muscle spasms.  Dispense: 90 tablet; Refill: 3 - traMADol (ULTRAM) 50 MG tablet; Take 1 tablet (50 mg total) by mouth 3 (three) times daily as needed for moderate pain. 30 day supply  Dispense: 90 tablet; Refill: 1  5.,6.  Encounter for long-term (current) use of medications; hyperlipidemia Lab was not available by the end of patient's visit.  At his next visit, he will have repeat fasting lipid panel in follow-up of hyperlipidemia as well as comprehensive metabolic panel in follow-up of long-term use of medications for the treatment of hypertension and hyperlipidemia - atorvastatin (LIPITOR) 20 MG tablet; Take 1 tablet (20 mg total) by mouth daily. In the evenings to lower cholesterol.  Dispense: 30 tablet; Refill: 3  7. Itchy scalp Refill provided for ketoconazole shampoo for treatment of itchy scalp - ketoconazole (NIZORAL) 2 % shampoo; Apply 1 application topically 2 (two) times a  week. Shampoo hair twice per week as needed  Dispense: 120 mL; Refill: 11   An After Visit Summary was printed and given to the patient.   Follow-up: Return in about 4 weeks (around 08/29/2019) for HTN/chronic issues.    Cain Saupe, MD

## 2019-08-01 NOTE — Progress Notes (Signed)
Pt. Is here for medication refill for all of his medication.

## 2019-08-27 MED FILL — AMLODIPINE BESYLATE 5 MG TA: 5 | 30 days supply | Qty: 30 | Fill #1

## 2019-08-30 ENCOUNTER — Ambulatory Visit: Payer: Self-pay | Admitting: Family Medicine

## 2019-09-16 MED FILL — FLUTICASONE PROP 50 MCG SPR: 50 | 30 days supply | Qty: 16 | Fill #0

## 2019-09-16 MED FILL — ?CETIRIZINE HCL 10 MG TABLE: 10 | 30 days supply | Qty: 30 | Fill #0

## 2019-09-16 MED FILL — AZITHROMYCIN 250 MG TABLET: 250 | 5 days supply | Qty: 6 | Fill #0

## 2019-09-16 MED FILL — ?PREDINSONE 10MG TABLETS: 10 | 7 days supply | Qty: 20 | Fill #0

## 2019-09-16 MED FILL — ?IBUPROFEN 800 MG TABS: AMNEAL | 30 days supply | Qty: 90 | Fill #0

## 2019-09-16 MED FILL — NOREL AD TABLET: 4-10-325 | 20 days supply | Qty: 20 | Fill #0

## 2019-09-16 MED FILL — ?ATORVASTATIN 20 MG TABLET: 20 | 30 days supply | Qty: 30 | Fill #1

## 2019-09-16 MED FILL — GABAPENTIN 300 MG CAPSULE: 300 | 30 days supply | Qty: 30 | Fill #0

## 2019-10-02 MED FILL — AMLODIPINE BESYLATE 5 MG TA: 5 | 90 days supply | Qty: 90 | Fill #0

## 2019-10-31 MED FILL — predniSONE 20 MG TABS: 20 | 7 days supply | Qty: 10 | Fill #0

## 2019-10-31 MED FILL — NOREL AD TABLET: 4-10-325 | 10 days supply | Qty: 20 | Fill #0

## 2019-11-13 ENCOUNTER — Ambulatory Visit: Payer: Self-pay

## 2019-11-29 ENCOUNTER — Other Ambulatory Visit: Payer: Self-pay | Admitting: Family Medicine

## 2019-11-29 DIAGNOSIS — K219 Gastro-esophageal reflux disease without esophagitis: Secondary | ICD-10-CM

## 2019-11-29 MED FILL — ?IBUPROFEN 800MG TABLETS: 800 | 30 days supply | Qty: 90 | Fill #1

## 2019-11-29 MED FILL — FLUTICASONE PROP 50 MCG SPR: 50 | 30 days supply | Qty: 16 | Fill #1

## 2019-11-29 MED FILL — traMADol HCL 50 MG TABS: 50 | 30 days supply | Qty: 90 | Fill #1

## 2019-11-29 MED FILL — ?OMEPRAZOLE 20 MG CAP DR: 20 | 30 days supply | Qty: 30 | Fill #0

## 2019-11-29 MED FILL — ?CETIRIZINE HCL 10 MG TABLE: 10 | 30 days supply | Qty: 30 | Fill #1

## 2019-11-29 MED FILL — AMLODIPINE BESYLATE 5 MG TA: 5 | 30 days supply | Qty: 30 | Fill #1

## 2020-02-11 ENCOUNTER — Other Ambulatory Visit: Payer: Self-pay | Admitting: Internal Medicine

## 2020-02-11 MED FILL — GABAPENTIN 300 MG CAPSULE: 300 | 30 days supply | Qty: 30 | Fill #0

## 2020-02-11 MED FILL — ?IBUPROFEN 800MG TABLETS: 800 | 30 days supply | Qty: 90 | Fill #0

## 2020-02-11 MED FILL — FLUTICASONE PROP 50 MCG SPR: 50 | 30 days supply | Qty: 16 | Fill #0

## 2020-02-11 MED FILL — ?ATORVASTATIN 20 MG TABLET: 20 | 30 days supply | Qty: 30 | Fill #0

## 2020-02-11 MED FILL — ?CETIRIZINE HCL 10 MG TABLE: 10 | 30 days supply | Qty: 30 | Fill #0

## 2020-02-11 MED FILL — HYDROCORTISONE ACE-PRAMOXIN: 2.5-1 | 20 days supply | Qty: 30 | Fill #0

## 2020-02-11 MED FILL — ?PANTOPRAZOLE SODI DR 40MGT: 40 | 30 days supply | Qty: 30 | Fill #0

## 2020-02-11 MED FILL — SILDENAFIL CITRATE 100 MG T: 100 | 30 days supply | Qty: 10 | Fill #0

## 2020-02-11 MED FILL — ?AMLODIPINE BESYLATE 5MG TA: 5 | 30 days supply | Qty: 30 | Fill #0

## 2020-02-11 MED FILL — ?TRAZODONE HCL 100MG TABLE: 100 | 30 days supply | Qty: 30 | Fill #0

## 2020-02-17 ENCOUNTER — Other Ambulatory Visit: Payer: Self-pay | Admitting: Family Medicine

## 2020-02-17 DIAGNOSIS — M545 Low back pain, unspecified: Secondary | ICD-10-CM

## 2020-02-17 NOTE — Telephone Encounter (Signed)
Requested medication (s) are due for refill today: yes  Requested medication (s) are on the active medication list: yes  Last refill:  08/01/19 #90 with 1 refill  Future visit scheduled: yes  Notes to clinic:  Please review for refill. Refill not delegated per protocol.     Requested Prescriptions  Pending Prescriptions Disp Refills   traMADol (ULTRAM) 50 MG tablet [Pharmacy Med Name: traMADol HCL 50 MG TABS 50 Tablet] 90 tablet 1    Sig: TAKE 1 TABLET BY MOUTH THREE TIMES DAILY AS NEEDED FOR MODERATE PAIN.MUST LAST 30 DAYS      Not Delegated - Analgesics:  Opioid Agonists Failed - 02/17/2020  9:36 AM      Failed - This refill cannot be delegated      Failed - Urine Drug Screen completed in last 360 days.      Failed - Valid encounter within last 6 months    Recent Outpatient Visits           6 months ago Prediabetes   [name], [name], MD   1 year ago Elbow pain, chronic, right   [name], [name], MD   1 year ago Essential hypertension   [name], MD   1 year ago Chronic bilateral thoracic back pain   [name], [name], MD   1 year ago Chronic midline low back pain with left-sided sciatica   [name], MD

## 2020-04-06 ENCOUNTER — Other Ambulatory Visit: Payer: Self-pay | Admitting: Family Medicine

## 2020-04-06 DIAGNOSIS — M545 Low back pain, unspecified: Secondary | ICD-10-CM

## 2020-04-06 MED FILL — FLUTICASONE PROP 50 MCG SPR: 50 | 30 days supply | Qty: 16 | Fill #1

## 2020-04-06 MED FILL — IBUPROFEN 800 MG TABLET: 800 | 30 days supply | Qty: 90 | Fill #1

## 2020-04-06 MED FILL — ?CETIRIZINE HCL 10 MG TABLE: 10 | 30 days supply | Qty: 30 | Fill #1

## 2020-04-06 MED FILL — ?TRAZODONE HCL 100MG TABLE: 100 | 30 days supply | Qty: 30 | Fill #1

## 2020-04-06 MED FILL — GABAPENTIN 300 MG CAPSULE: 300 | 30 days supply | Qty: 30 | Fill #1

## 2020-04-06 MED FILL — ?AMLODIPINE BESYLATE 5MG TA: 5 | 30 days supply | Qty: 30 | Fill #1

## 2020-04-06 NOTE — Telephone Encounter (Signed)
Requested medication (s) are due for refill today:yes  Requested medication (s) are on the active medication list: yes  Last refill:08/01/19  #90  1 refill  Future visit scheduled  no  Notes to clinic:not delegated. Pt needs appt, attempted to reach"Call cannot be completed."  Requested Prescriptions  Pending Prescriptions Disp Refills   traMADol (ULTRAM) 50 MG tablet [Pharmacy Med Name: traMADol HCL 50 MG TABS 50 Tablet] 90 tablet 1    Sig: TAKE 1 TABLET BY MOUTH THREE TIMES DAILY AS NEEDED FOR MODERATE PAIN.MUST LAST 30 DAYS      Not Delegated - Analgesics:  Opioid Agonists Failed - 04/06/2020  4:40 PM      Failed - This refill cannot be delegated      Failed - Urine Drug Screen completed in last 360 days.      Failed - Valid encounter within last 6 months    Recent Outpatient Visits           8 months ago Prediabetes   [facility] Fulp, [place], MD   1 year ago Elbow pain, chronic, right   [facility] Fulp, [place], MD   1 year ago Essential hypertension   [facility] Debby Bud, MD   1 year ago Chronic bilateral thoracic back pain   [facility] [place], [place], MD   1 year ago Chronic midline low back pain with left-sided sciatica   [facility] Cain Saupe, MD

## 2020-04-27 MED FILL — HYDROCORTISONE ACETATE 25 M: 25 | 15 days supply | Qty: 30 | Fill #0

## 2020-05-05 ENCOUNTER — Other Ambulatory Visit: Payer: Self-pay | Admitting: Internal Medicine

## 2020-05-05 MED FILL — traMADol HCL 50 MG TABS: 50 | 5 days supply | Qty: 10 | Fill #0

## 2020-06-01 MED FILL — IBUPROFEN 800 MG TABLET: 800 | 30 days supply | Qty: 90 | Fill #2

## 2020-06-01 MED FILL — AMLODIPINE BESYLATE 5 MG TA: 5 | 30 days supply | Qty: 30 | Fill #2

## 2020-06-01 MED FILL — traMADol HCL 50 MG TABS: 50 | 5 days supply | Qty: 10 | Fill #0

## 2020-06-01 MED FILL — ?CETIRIZINE HCL 10 MG TABLE: 10 | 30 days supply | Qty: 30 | Fill #2

## 2020-07-21 ENCOUNTER — Other Ambulatory Visit: Payer: Self-pay | Admitting: Internal Medicine

## 2020-07-21 DIAGNOSIS — M545 Low back pain, unspecified: Secondary | ICD-10-CM

## 2020-07-21 MED FILL — ?CETIRIZINE HCL 10 MG TABLE: 10 | 30 days supply | Qty: 30 | Fill #3

## 2020-07-21 MED FILL — IBUPROFEN 800 MG TABLET: 800 | 30 days supply | Qty: 90 | Fill #3

## 2020-07-21 MED FILL — AMLODIPINE BESYLATE 5 MG TA: 5 | 30 days supply | Qty: 30 | Fill #3

## 2020-08-13 ENCOUNTER — Other Ambulatory Visit: Payer: Self-pay | Admitting: Internal Medicine

## 2020-08-13 MED FILL — ?DOXYCYCLINE HYCLATE 100 MG: 100 | 10 days supply | Qty: 20 | Fill #0

## 2020-08-13 MED FILL — traMADol HCL 50 MG TABS: 50 | 5 days supply | Qty: 10 | Fill #0

## 2020-08-13 MED FILL — predniSONE 20 MG TABS: 20 | 7 days supply | Qty: 10 | Fill #0

## 2020-08-15 LAB — COMPLETE METABOLIC PANEL WITH GFR
AG Ratio: 1.4 (calc) (ref 1.0–2.5)
ALT: 14 U/L (ref 9–46)
AST: 20 U/L (ref 10–35)
Albumin: 4 g/dL (ref 3.6–5.1)
Alkaline phosphatase (APISO): 78 U/L (ref 35–144)
BUN: 15 mg/dL (ref 7–25)
CO2: 25 mmol/L (ref 20–32)
Calcium: 9.3 mg/dL (ref 8.6–10.3)
Chloride: 104 mmol/L (ref 98–110)
Creat: 1.19 mg/dL (ref 0.70–1.25)
GFR, Est African American: 75 mL/min/{1.73_m2} (ref >=60)
GFR, Est Non African American: 65 mL/min/{1.73_m2} (ref >=60)
Globulin: 2.8 g/dL (calc) (ref 1.9–3.7)
Glucose, Bld: 93 mg/dL (ref 65–99)
Potassium: 4.1 mmol/L (ref 3.5–5.3)
Sodium: 141 mmol/L (ref 135–146)
Total Bilirubin: 0.5 mg/dL (ref 0.2–1.2)
Total Protein: 6.8 g/dL (ref 6.1–8.1)

## 2020-08-15 LAB — CBC
HCT: 45.7 % (ref 38.5–50.0)
Hemoglobin: 15.8 g/dL (ref 13.2–17.1)
MCH: 30.7 pg (ref 27.0–33.0)
MCHC: 34.6 g/dL (ref 32.0–36.0)
MCV: 88.7 fL (ref 80.0–100.0)
MPV: 11.4 fL (ref 7.5–12.5)
Platelets: 182 10*3/uL (ref 140–400)
RBC: 5.15 10*6/uL (ref 4.20–5.80)
RDW: 13.5 % (ref 11.0–15.0)
WBC: 5.1 10*3/uL (ref 3.8–10.8)

## 2020-08-15 LAB — SARS-COV-2 RNA,(COVID-19) QUALITATIVE NAAT: SARS CoV2 RNA: DETECTED — CR

## 2020-08-15 LAB — PSA: PSA: 0.46 ng/mL (ref <=4.0)

## 2020-08-15 LAB — LIPID PANEL
Cholesterol: 226 mg/dL — ABNORMAL HIGH (ref <200)
HDL: 37 mg/dL — ABNORMAL LOW (ref >=40)
Non-HDL Cholesterol (Calc): 189 mg/dL (calc) — ABNORMAL HIGH (ref <130)
Total CHOL/HDL Ratio: 6.1 (calc) — ABNORMAL HIGH (ref <5.0)
Triglycerides: 580 mg/dL — ABNORMAL HIGH (ref <150)

## 2020-08-19 ENCOUNTER — Other Ambulatory Visit: Payer: Self-pay | Admitting: Internal Medicine

## 2020-08-19 MED FILL — ?CETIRIZINE HCL 10 MG TABLE: 10 | 30 days supply | Qty: 30 | Fill #4

## 2020-08-19 MED FILL — AMLODIPINE BESYLATE 5 MG TA: 5 | 30 days supply | Qty: 30 | Fill #0

## 2020-08-19 MED FILL — IBUPROFEN 800 MG TABLET: 800 | 30 days supply | Qty: 90 | Fill #4

## 2020-08-19 MED FILL — FLUTICASONE PROP 50 MCG SPR: 50 | 30 days supply | Qty: 16 | Fill #2

## 2020-08-19 MED FILL — TRAZODONE HCL 100 MG TABS: 100 | 30 days supply | Qty: 30 | Fill #2

## 2020-08-19 MED FILL — ?ATORVASTATIN 20 MG TABLET: 20 | 30 days supply | Qty: 30 | Fill #0

## 2020-09-29 ENCOUNTER — Other Ambulatory Visit: Payer: Self-pay | Admitting: Family Medicine

## 2020-09-29 DIAGNOSIS — L299 Pruritus, unspecified: Secondary | ICD-10-CM

## 2020-09-29 MED FILL — FLUTICASONE PROP 50 MCG SPR: 50 | 30 days supply | Qty: 16 | Fill #3

## 2020-09-29 MED FILL — ?ATORVASTATIN 20 MG TABLET: 20 | 30 days supply | Qty: 30 | Fill #1

## 2020-09-29 MED FILL — ?CETIRIZINE HCL 10 MG TABLE: 10 | 30 days supply | Qty: 30 | Fill #5

## 2020-09-29 MED FILL — AMLODIPINE BESYLATE 5 MG TA: 5 | 30 days supply | Qty: 30 | Fill #1

## 2020-09-29 MED FILL — GABAPENTIN 300 MG CAPSULE: 300 | 30 days supply | Qty: 30 | Fill #0

## 2020-09-29 MED FILL — IBUPROFEN 800 MG TABLET: 800 | 30 days supply | Qty: 90 | Fill #5

## 2020-10-20 ENCOUNTER — Other Ambulatory Visit: Payer: Self-pay | Admitting: Internal Medicine

## 2020-10-20 ENCOUNTER — Other Ambulatory Visit: Payer: Self-pay

## 2020-10-20 MED ORDER — IBUPROFEN 800 MG PO TABS
800.0000 mg | ORAL_TABLET | Freq: Three times a day (TID) | ORAL | 0 refills | Status: DC | PRN
  Filled 2020-10-20: qty 90, 30d supply, fill #0

## 2020-10-20 MED FILL — Pantoprazole Sodium EC Tab 40 MG (Base Equiv): ORAL | 30 days supply | Qty: 30 | Fill #0 | Status: AC

## 2020-10-20 MED FILL — Atorvastatin Calcium Tab 20 MG (Base Equivalent): ORAL | 30 days supply | Qty: 30 | Fill #0 | Status: AC

## 2020-10-20 MED FILL — Amlodipine Besylate Tab 5 MG (Base Equivalent): ORAL | 30 days supply | Qty: 30 | Fill #0 | Status: AC

## 2020-10-20 MED FILL — Gabapentin Cap 300 MG: ORAL | 30 days supply | Qty: 30 | Fill #0 | Status: CN

## 2020-10-20 MED FILL — Fluticasone Propionate Nasal Susp 50 MCG/ACT: NASAL | 30 days supply | Qty: 16 | Fill #0 | Status: AC

## 2020-10-21 ENCOUNTER — Other Ambulatory Visit: Payer: Self-pay | Admitting: Internal Medicine

## 2020-10-21 ENCOUNTER — Other Ambulatory Visit: Payer: Self-pay

## 2020-10-21 DIAGNOSIS — L299 Pruritus, unspecified: Secondary | ICD-10-CM

## 2020-10-27 ENCOUNTER — Other Ambulatory Visit: Payer: Self-pay

## 2020-10-27 MED ORDER — GABAPENTIN 300 MG PO CAPS
300.0000 mg | ORAL_CAPSULE | Freq: Every evening | ORAL | 2 refills | Status: DC
  Filled 2020-10-27: qty 30, 30d supply, fill #0
  Filled 2020-11-24: qty 90, 90d supply, fill #0

## 2020-10-27 MED ORDER — TRAZODONE HCL 100 MG PO TABS
ORAL_TABLET | ORAL | 5 refills | Status: DC
  Filled 2020-10-27 – 2021-02-05 (×2): qty 30, 30d supply, fill #0

## 2020-10-27 MED ORDER — FLUTICASONE PROPIONATE 50 MCG/ACT NA SUSP
NASAL | 5 refills | Status: DC
  Filled 2020-10-27 – 2020-11-24 (×2): qty 16, 30d supply, fill #0

## 2020-10-27 MED ORDER — TRIAMCINOLONE ACETONIDE 0.1 % EX CREA
TOPICAL_CREAM | CUTANEOUS | 2 refills | Status: DC
  Filled 2020-10-27: qty 454, 30d supply, fill #0

## 2020-10-27 MED ORDER — AMLODIPINE BESYLATE 5 MG PO TABS
1.0000 | ORAL_TABLET | Freq: Every day | ORAL | 2 refills | Status: DC
  Filled 2020-11-24: qty 30, 30d supply, fill #0
  Filled 2020-12-22: qty 30, 30d supply, fill #1
  Filled 2021-02-05: qty 30, 30d supply, fill #2

## 2020-10-27 MED ORDER — PREDNISONE 20 MG PO TABS
ORAL_TABLET | ORAL | 0 refills | Status: DC
  Filled 2020-10-27: qty 10, 7d supply, fill #0

## 2020-10-27 MED ORDER — AZITHROMYCIN 250 MG PO TABS
ORAL_TABLET | ORAL | 0 refills | Status: DC
  Filled 2020-10-27: qty 6, 5d supply, fill #0

## 2020-10-27 MED ORDER — CETIRIZINE HCL 10 MG PO TABS
ORAL_TABLET | ORAL | 2 refills | Status: DC
  Filled 2020-10-27: qty 30, 30d supply, fill #0
  Filled 2020-11-25: qty 30, 30d supply, fill #1
  Filled 2021-02-05: qty 30, 30d supply, fill #2

## 2020-10-27 MED ORDER — IBUPROFEN 800 MG PO TABS
ORAL_TABLET | ORAL | 2 refills | Status: DC
  Filled 2020-11-24: qty 90, 30d supply, fill #0
  Filled 2020-12-22: qty 90, 30d supply, fill #1
  Filled 2021-02-05: qty 90, 30d supply, fill #2

## 2020-10-27 MED ORDER — KETOCONAZOLE 2 % EX SHAM
MEDICATED_SHAMPOO | CUTANEOUS | 2 refills | Status: DC
  Filled 2020-10-27: qty 120, 30d supply, fill #0
  Filled 2020-11-24: qty 120, 30d supply, fill #1
  Filled 2020-12-22: qty 120, 30d supply, fill #2

## 2020-10-28 ENCOUNTER — Other Ambulatory Visit: Payer: Self-pay

## 2020-10-28 MED ORDER — TRAMADOL HCL 50 MG PO TABS
ORAL_TABLET | ORAL | 0 refills | Status: DC
  Filled 2020-10-28 – 2020-11-24 (×2): qty 10, 5d supply, fill #0

## 2020-10-29 ENCOUNTER — Other Ambulatory Visit: Payer: Self-pay

## 2020-11-04 ENCOUNTER — Other Ambulatory Visit: Payer: Self-pay

## 2020-11-06 ENCOUNTER — Other Ambulatory Visit: Payer: Self-pay

## 2020-11-09 ENCOUNTER — Other Ambulatory Visit: Payer: Self-pay

## 2020-11-09 ENCOUNTER — Other Ambulatory Visit: Payer: Self-pay | Admitting: Internal Medicine

## 2020-11-11 ENCOUNTER — Other Ambulatory Visit: Payer: Self-pay

## 2020-11-12 ENCOUNTER — Other Ambulatory Visit: Payer: Self-pay

## 2020-11-12 MED ORDER — TRAZODONE HCL 100 MG PO TABS
ORAL_TABLET | ORAL | 5 refills | Status: DC
  Filled 2020-11-12 – 2020-11-24 (×2): qty 30, 30d supply, fill #0

## 2020-11-18 ENCOUNTER — Other Ambulatory Visit: Payer: Self-pay

## 2020-11-19 ENCOUNTER — Other Ambulatory Visit: Payer: Self-pay

## 2020-11-20 ENCOUNTER — Other Ambulatory Visit: Payer: Self-pay

## 2020-11-24 ENCOUNTER — Encounter (INDEPENDENT_AMBULATORY_CARE_PROVIDER_SITE_OTHER): Payer: Self-pay

## 2020-11-24 ENCOUNTER — Other Ambulatory Visit: Payer: Self-pay

## 2020-11-25 ENCOUNTER — Other Ambulatory Visit: Payer: Self-pay

## 2020-11-25 ENCOUNTER — Encounter (INDEPENDENT_AMBULATORY_CARE_PROVIDER_SITE_OTHER): Payer: Self-pay

## 2020-11-25 ENCOUNTER — Other Ambulatory Visit (HOSPITAL_BASED_OUTPATIENT_CLINIC_OR_DEPARTMENT_OTHER): Payer: Self-pay

## 2020-11-27 ENCOUNTER — Other Ambulatory Visit: Payer: Self-pay

## 2020-12-01 ENCOUNTER — Other Ambulatory Visit: Payer: Self-pay

## 2020-12-01 MED ORDER — TIZANIDINE HCL 4 MG PO TABS
ORAL_TABLET | ORAL | 2 refills | Status: DC
  Filled 2020-12-01 – 2020-12-02 (×2): qty 60, 30d supply, fill #0
  Filled 2020-12-22: qty 60, 30d supply, fill #1
  Filled 2021-02-05: qty 60, 30d supply, fill #2

## 2020-12-01 MED ORDER — PREDNISONE 20 MG PO TABS
ORAL_TABLET | ORAL | 0 refills | Status: DC
  Filled 2020-12-01: qty 10, 7d supply, fill #0

## 2020-12-02 ENCOUNTER — Other Ambulatory Visit: Payer: Self-pay

## 2020-12-22 ENCOUNTER — Other Ambulatory Visit: Payer: Self-pay

## 2020-12-22 MED ORDER — TRAMADOL HCL 50 MG PO TABS
ORAL_TABLET | ORAL | 0 refills | Status: DC
  Filled 2020-12-22 – 2021-02-05 (×2): qty 10, 5d supply, fill #0

## 2020-12-29 ENCOUNTER — Other Ambulatory Visit: Payer: Self-pay

## 2021-01-01 ENCOUNTER — Other Ambulatory Visit: Payer: Self-pay

## 2021-01-06 ENCOUNTER — Other Ambulatory Visit: Payer: Self-pay

## 2021-02-05 ENCOUNTER — Other Ambulatory Visit: Payer: Self-pay

## 2021-02-05 MED FILL — Fluticasone Propionate Nasal Susp 50 MCG/ACT: NASAL | 30 days supply | Qty: 16 | Fill #1 | Status: CN

## 2021-02-05 MED FILL — Pantoprazole Sodium EC Tab 40 MG (Base Equiv): ORAL | 30 days supply | Qty: 30 | Fill #1 | Status: CN

## 2021-02-08 ENCOUNTER — Other Ambulatory Visit: Payer: Self-pay

## 2021-02-08 MED ORDER — KETOCONAZOLE 2 % EX SHAM
MEDICATED_SHAMPOO | CUTANEOUS | 2 refills | Status: DC
  Filled 2021-02-08: qty 120, 30d supply, fill #0

## 2021-02-16 ENCOUNTER — Other Ambulatory Visit: Payer: Self-pay

## 2021-02-16 MED ORDER — TRAZODONE HCL 100 MG PO TABS
ORAL_TABLET | ORAL | 2 refills | Status: DC
  Filled 2021-02-16: qty 30, 30d supply, fill #0

## 2021-02-16 MED ORDER — TIZANIDINE HCL 4 MG PO TABS
ORAL_TABLET | ORAL | 2 refills | Status: DC
  Filled 2021-02-16 – 2022-02-09 (×2): qty 60, 30d supply, fill #0

## 2021-02-16 MED ORDER — FLUTICASONE PROPIONATE 50 MCG/ACT NA SUSP
NASAL | 2 refills | Status: DC
  Filled 2021-02-16: qty 16, 30d supply, fill #0

## 2021-02-16 MED ORDER — CETIRIZINE HCL 10 MG PO TABS: ORAL_TABLET | ORAL | 2 refills | Status: DC

## 2021-02-16 MED ORDER — CIPROFLOXACIN HCL 500 MG PO TABS
ORAL_TABLET | ORAL | 0 refills | Status: DC
  Filled 2021-02-16: qty 30, 15d supply, fill #0

## 2021-02-16 MED ORDER — ATORVASTATIN CALCIUM 20 MG PO TABS
ORAL_TABLET | ORAL | 2 refills | Status: DC
  Filled 2021-02-16: qty 30, 30d supply, fill #0

## 2021-02-16 MED ORDER — GABAPENTIN 300 MG PO CAPS
ORAL_CAPSULE | ORAL | 2 refills | Status: DC
  Filled 2021-02-16: qty 30, 30d supply, fill #0

## 2021-02-16 MED ORDER — IBUPROFEN 800 MG PO TABS
ORAL_TABLET | ORAL | 2 refills | Status: DC
  Filled 2021-02-16: qty 90, 30d supply, fill #0

## 2021-02-16 MED ORDER — PANTOPRAZOLE SODIUM 40 MG PO TBEC
40.0000 mg | DELAYED_RELEASE_TABLET | Freq: Every day | ORAL | 2 refills | Status: DC
  Filled 2021-02-16: qty 30, 30d supply, fill #0

## 2021-02-16 MED ORDER — MEFLOQUINE HCL 250 MG PO TABS
ORAL_TABLET | ORAL | 0 refills | Status: DC
  Filled 2021-02-16: qty 12, 84d supply, fill #0

## 2021-02-16 MED ORDER — SILDENAFIL CITRATE 100 MG PO TABS
ORAL_TABLET | ORAL | 2 refills | Status: DC
  Filled 2021-02-16: qty 10, 30d supply, fill #0

## 2021-02-16 MED ORDER — AMLODIPINE BESYLATE 5 MG PO TABS
5.0000 mg | ORAL_TABLET | Freq: Every day | ORAL | 2 refills | Status: DC
  Filled 2021-02-16: qty 30, 30d supply, fill #0

## 2021-02-17 ENCOUNTER — Other Ambulatory Visit: Payer: Self-pay

## 2021-02-18 ENCOUNTER — Other Ambulatory Visit: Payer: Self-pay

## 2021-02-18 ENCOUNTER — Ambulatory Visit: Payer: Self-pay | Attending: Internal Medicine

## 2021-02-18 ENCOUNTER — Other Ambulatory Visit (HOSPITAL_BASED_OUTPATIENT_CLINIC_OR_DEPARTMENT_OTHER): Payer: Self-pay

## 2021-02-18 DIAGNOSIS — Z23 Encounter for immunization: Secondary | ICD-10-CM

## 2021-02-18 MED ORDER — COVID-19 MRNA VACC (MODERNA) 100 MCG/0.5ML IM SUSP
INTRAMUSCULAR | 0 refills | Status: DC
  Filled 2021-02-18: qty 0.5, 1d supply, fill #0

## 2021-02-18 NOTE — Progress Notes (Signed)
   Covid-19 Vaccination Clinic  Name:  Howard Rice    MRN: [id number] DOB: 03-07-1961  02/18/2021  Howard Rice was observed post Covid-19 immunization for 15 minutes without incident. He was provided with Vaccine Information Sheet and instruction to access the V-Safe system.   Howard Rice was instructed to call 911 with any severe reactions post vaccine: Difficulty breathing  Swelling of face and throat  A fast heartbeat  A bad rash all over body  Dizziness and weakness   Immunizations Administered     Name Date Dose VIS Date Route   Moderna COVID-19 Vaccine 02/18/2021  4:00 PM 0.5 mL 04/29/2020 Intramuscular   Manufacturer: Moderna   Lot: [id number]   NDC: 78978-478-41

## 2021-02-19 ENCOUNTER — Other Ambulatory Visit: Payer: Self-pay

## 2021-10-23 ENCOUNTER — Emergency Department (HOSPITAL_COMMUNITY): Payer: Self-pay

## 2021-10-23 ENCOUNTER — Other Ambulatory Visit: Payer: Self-pay

## 2021-10-23 ENCOUNTER — Emergency Department (HOSPITAL_COMMUNITY)
Admission: EM | Admit: 2021-10-23 | Discharge: 2021-10-23 | Disposition: A | Discharge status: Home Health | Payer: Self-pay | Attending: Emergency Medicine | Admitting: Emergency Medicine

## 2021-10-23 ENCOUNTER — Encounter (HOSPITAL_COMMUNITY): Payer: Self-pay | Admitting: Emergency Medicine

## 2021-10-23 DIAGNOSIS — Z79899 Other long term (current) drug therapy: Secondary | ICD-10-CM | POA: Insufficient documentation

## 2021-10-23 DIAGNOSIS — D72829 Elevated white blood cell count, unspecified: Secondary | ICD-10-CM | POA: Insufficient documentation

## 2021-10-23 DIAGNOSIS — Z20822 Contact with and (suspected) exposure to covid-19: Secondary | ICD-10-CM | POA: Insufficient documentation

## 2021-10-23 DIAGNOSIS — J101 Influenza due to other identified influenza virus with other respiratory manifestations: Secondary | ICD-10-CM | POA: Insufficient documentation

## 2021-10-23 LAB — CBC
HCT: 43.6 % (ref 39.0–52.0)
Hemoglobin: 14.6 g/dL (ref 13.0–17.0)
MCH: 29.9 pg (ref 26.0–34.0)
MCHC: 33.5 g/dL (ref 30.0–36.0)
MCV: 89.2 fL (ref 80.0–100.0)
Platelets: 268 10*3/uL (ref 150–400)
RBC: 4.89 MIL/uL (ref 4.22–5.81)
RDW: 13.6 % (ref 11.5–15.5)
WBC: 10.7 10*3/uL — ABNORMAL HIGH (ref 4.0–10.5)
nRBC: 0 % (ref 0.0–0.2)

## 2021-10-23 LAB — BASIC METABOLIC PANEL
Anion gap: 5 (ref 5–15)
BUN: 15 mg/dL (ref 8–23)
CO2: 24 mmol/L (ref 22–32)
Calcium: 8.6 mg/dL — ABNORMAL LOW (ref 8.9–10.3)
Chloride: 107 mmol/L (ref 98–111)
Creatinine, Ser: 1.12 mg/dL (ref 0.61–1.24)
GFR, Estimated: >60 mL/min (ref >60)
Glucose, Bld: 111 mg/dL — ABNORMAL HIGH (ref 70–99)
Potassium: 5.4 mmol/L — ABNORMAL HIGH (ref 3.5–5.1)
Sodium: 136 mmol/L (ref 135–145)

## 2021-10-23 LAB — I-STAT CHEM 8, ED
BUN: 19 mg/dL (ref 8–23)
Calcium, Ion: 0.99 mmol/L — ABNORMAL LOW (ref 1.15–1.40)
Chloride: 106 mmol/L (ref 98–111)
Creatinine, Ser: 1 mg/dL (ref 0.61–1.24)
Glucose, Bld: 104 mg/dL — ABNORMAL HIGH (ref 70–99)
HCT: 43 % (ref 39.0–52.0)
Hemoglobin: 14.6 g/dL (ref 13.0–17.0)
Potassium: 4.3 mmol/L (ref 3.5–5.1)
Sodium: 137 mmol/L (ref 135–145)
TCO2: 26 mmol/L (ref 22–32)

## 2021-10-23 LAB — URINALYSIS, ROUTINE W REFLEX MICROSCOPIC
Bilirubin Urine: NEGATIVE
Glucose, UA: NEGATIVE mg/dL
Hgb urine dipstick: NEGATIVE
Ketones, ur: NEGATIVE mg/dL
Leukocytes,Ua: NEGATIVE
Nitrite: NEGATIVE
Protein, ur: NEGATIVE mg/dL
Specific Gravity, Urine: 1.01 (ref 1.005–1.030)
pH: 7 (ref 5.0–8.0)

## 2021-10-23 LAB — RESP PANEL BY RT-PCR (FLU A&B, COVID) ARPGX2
Influenza A by PCR: POSITIVE — AB
Influenza B by PCR: NEGATIVE
SARS Coronavirus 2 by RT PCR: NEGATIVE

## 2021-10-23 MED ORDER — OXYMETAZOLINE HCL 0.05 % NA SOLN
1.0000 | Freq: Once | NASAL | Status: AC
Start: 2021-10-23 — End: 2021-10-23
  Administered 2021-10-23: 1 via NASAL
  Filled 2021-10-23: qty 30

## 2021-10-23 MED ORDER — ALBUTEROL SULFATE HFA 108 (90 BASE) MCG/ACT IN AERS
2.0000 | INHALATION_SPRAY | Freq: Once | RESPIRATORY_TRACT | Status: AC
  Administered 2021-10-23: 2 via RESPIRATORY_TRACT
  Filled 2021-10-23: qty 6.7

## 2021-10-23 MED ORDER — ACETAMINOPHEN 325 MG PO TABS
650.0000 mg | ORAL_TABLET | Freq: Once | ORAL | Status: AC
  Administered 2021-10-23: 650 mg via ORAL
  Filled 2021-10-23: qty 2

## 2021-10-23 MED ORDER — LACTATED RINGERS IV BOLUS
1000.0000 mL | Freq: Once | INTRAVENOUS | Status: AC
  Administered 2021-10-23: 1000 mL via INTRAVENOUS

## 2021-10-23 MED ORDER — AEROCHAMBER PLUS FLO-VU MEDIUM MISC
1.0000 | Freq: Once | Status: DC
  Filled 2021-10-23: qty 1

## 2021-10-23 NOTE — ED Provider Notes (Signed)
?[facility] EMERGENCY DEPARTMENT ?Provider Note ? ? ?CSN: [id number] ?Arrival date & time: 10/23/21  0846 ? ?  ? ?History ? ?No chief complaint on file. ? ? ?Howard Rice is a 61 y.o. male. ? ?HPI ?61 yo male ho hypertension, out of meds, presents today complaining of cough and cold symptoms.  Sxs began 10 days ago with nasal congestion.  Patient with positive flu test compalining of feeling hot, nasal congestion, and wheezing.  He has had difficulty sleeping because of cough.  Patient has been fasting for ramadan. ? ? ?  ? ?Home Medications ?Prior to Admission medications   ?Medication Sig Start Date End Date Taking? Authorizing Provider  ?amLODipine (NORVASC) 5 MG tablet Take 1 tablet (5 mg total) by mouth daily. To lower blood pressure 08/01/19   Fulp, Cammie, MD  ?amLODipine (NORVASC) 5 MG tablet TAKE 1 TABLET BY MOUTH ONCE DAILY 08/19/20 08/19/21  Fleet Contras, MD  ?amLODipine (NORVASC) 5 MG tablet TAKE 1 TABLET BY MOUTH ONCE A DAY 02/11/20 02/10/21  Fleet Contras, MD  ?amLODipine (NORVASC) 5 MG tablet take 1 tablet by mouth daily 10/27/20     ?amLODipine (NORVASC) 5 MG tablet Take 1 tablet by mouth once daily 02/16/21     ?amoxicillin (AMOXIL) 500 MG capsule Take 1 capsule (500 mg total) by mouth 3 (three) times daily. ?Patient not taking: Reported on 09/04/2018 04/06/18   Cain Saupe, MD  ?atorvastatin (LIPITOR) 20 MG tablet Take 1 tablet (20 mg total) by mouth daily. In the evenings to lower cholesterol. 08/01/19   Fulp, Cammie, MD  ?atorvastatin (LIPITOR) 20 MG tablet TAKE 1 TABLET BY MOUTH AT BEDTIME 08/19/20 08/19/21  Fleet Contras, MD  ?atorvastatin (LIPITOR) 20 MG tablet Take 1 tablet by mouth at bedtime 02/16/21     ?azithromycin (ZITHROMAX) 250 MG tablet take 2 tablets by mouth today, then 1 tablet by mouth once daily for 4 days. 10/27/20     ?cetirizine (ZYRTEC ALLERGY) 10 MG tablet Take 1 tablet by mouth daily as needed. 02/16/21     ?cetirizine (ZYRTEC) 10 MG tablet Take 1 tablet (10 mg total) by  mouth daily. At bedtime for nasal congestion 08/01/19   Fulp, Cammie, MD  ?cetirizine (ZYRTEC) 10 MG tablet TAKE 1 TABLET BY MOUTH ONCE A DAY AS NEEDED 02/11/20 02/10/21  Fleet Contras, MD  ?cetirizine (ZYRTEC) 10 MG tablet take 1 tablet by mouth once daily as needed. 10/27/20     ?chlorpheniramine-HYDROcodone (TUSSIONEX PENNKINETIC ER) 10-8 MG/5ML LQCR Take 5 mLs by mouth every 12 (twelve) hours as needed. ?Patient not taking: Reported on 11/07/2018 06/18/11   Trixie Dredge, PA-C  ?ciprofloxacin (CIPRO) 500 MG tablet Take 1 tablet by mouth every 12 hours for traveler's diarrhea 02/16/21     ?COVID-19 mRNA vaccine, Moderna, 100 MCG/0.5ML injection Inject into the muscle. 02/18/21   Judyann Munson, MD  ?cyclobenzaprine (FLEXERIL) 10 MG tablet Take 1 tablet (10 mg total) by mouth at bedtime. For muscle spasms. 08/01/19   Fulp, Cammie, MD  ?diclofenac sodium (VOLTAREN) 1 % GEL Apply 2 g up to 4 times daily to the elbow as needed for pain ?Patient not taking: Reported on 08/01/2019 05/25/18   Cain Saupe, MD  ?fluticasone [facility] ALLERGY RELIEF) 50 MCG/ACT nasal spray use 2 sprays each nostril once daily 02/16/21     ?fluticasone (FLONASE) 50 MCG/ACT nasal spray Place 2 sprays into both nostrils daily. ?Patient not taking: Reported on 08/01/2019 11/07/18   Cain Saupe, MD  ?fluticasone [facility])  50 MCG/ACT nasal spray USE 2 SPRAYS IN EACH NOSTRIL DAILY 02/11/20 02/10/21  Fleet Contras, MD  ?fluticasone Aleda Grana) 50 MCG/ACT nasal spray use 2 sprays in each nostril once daily 10/27/20     ?gabapentin (NEURONTIN) 300 MG capsule Take 1 capsules at bedtime for nerve type pain/numbness in feet 08/01/19   Fulp, Cammie, MD  ?gabapentin (NEURONTIN) 300 MG capsule TAKE 1 CAPSULE BY MOUTH AT BEDTIME 08/19/20 08/19/21  Fleet Contras, MD  ?gabapentin (NEURONTIN) 300 MG capsule take 1 capsule by mouth nightly at bedtime 10/27/20     ?gabapentin (NEURONTIN) 300 MG capsule Take 1 tablet by mouth at bedtime 02/16/21     ?ibuprofen (ADVIL) 600 MG tablet Take 1  tablet (600 mg total) by mouth every 8 (eight) hours as needed. 08/01/19   Fulp, Cammie, MD  ?ibuprofen (ADVIL) 800 MG tablet Take 1 tablet (800 mg total) by mouth 3 (three) times daily as needed. 02/11/20   Fleet Contras, MD  ?ibuprofen (ADVIL) 800 MG tablet take 1 tab by mouth three times daily 10/27/20     ?ibuprofen (ADVIL) 800 MG tablet Take 1 tablet by mouth three times daily as needed 02/16/21     ?ketoconazole (NIZORAL) 2 % shampoo Apply 1 application topically 2 (two) times a week. Shampoo hair twice per week as needed 08/01/19   Fulp, Cammie, MD  ?ketoconazole (NIZORAL) 2 % shampoo Apply topically ot affected area twice a week. 02/08/21     ?loratadine (CLARITIN) 10 MG tablet Take 10 mg by mouth daily.      [provider]  ?mefloquine (LARIAM) 250 MG tablet Take 1 tablet weekly, take one week before travel to 4 weeks after return. 02/16/21     ?montelukast (SINGULAIR) 10 MG tablet Take 10 mg by mouth at bedtime.      [provider]  ?omeprazole (PRILOSEC) 20 MG capsule Take 1 capsule (20 mg total) by mouth daily. Must have office visit for refills 11/29/19   Fulp, Cammie, MD  ?oxyCODONE-acetaminophen (PERCOCET/ROXICET) 5-325 MG per tablet Take 1 tablet by mouth every 4 (four) hours as needed for pain. ?Patient not taking: Reported on 11/07/2018 06/20/12   Mancel Bale, MD  ?pantoprazole (PROTONIX) 40 MG tablet TAKE 1 TABLET BY MOUTH ONCE DAILY 08/19/20 08/19/21  Fleet Contras, MD  ?pantoprazole (PROTONIX) 40 MG tablet Take 1 tablet by mouth once daily 02/16/21     ?predniSONE (DELTASONE) 20 MG tablet Take 2 tablets by mouth daily for 3 days, then 1 tablet by mouth daily for 4 days 12/01/20     ?sildenafil (VIAGRA) 100 MG tablet TAKE 1 TABLET BY MOUTH ONCE DAILY AS NEEDED ONE HOUR PRIOR TO INTERCOURSE 08/19/20 08/19/21  Fleet Contras, MD  ?sildenafil (VIAGRA) 100 MG tablet Take 1 tablet by mouth as needed one hour prior to intercourse 02/16/21     ?tiZANidine (ZANAFLEX) 4 MG tablet Take 1 tablet twice daily  as neeeded 02/16/21     ?traMADol (ULTRAM) 50 MG tablet Take 1 tablet (50 mg total) by mouth 3 (three) times daily as needed for moderate pain. 30 day supply 08/01/19   Fulp, Cammie, MD  ?traMADol (ULTRAM) 50 MG tablet TAKE 1 TABLET BY MOUTH TWICE DAILY AS NEEDED FOR SEVERE PAINS. 10/27/20   Fleet Contras, MD  ?traMADol Janean Sark) 50 MG tablet Take 1 tablet by mouth twice daily as needed for severe pains 12/22/20     ?traZODone (DESYREL) 100 MG tablet TAKE 1 TABLET BY MOUTH EVERY NIGHT AT BEDTIME AS NEEDED FOR  SLEEP 02/11/20 02/10/21  Fleet ContrasAvbuere, Edwin, MD  ?traZODone (DESYREL) 100 MG tablet take 1 tablet by mouth nightly at bedtime as needed for sleep 10/27/20     ?traZODone (DESYREL) 100 MG tablet Take 1 tablet by mouth nightly as needed for sleep 11/12/20     ?traZODone (DESYREL) 100 MG tablet Take 1 tablet at bedtime as needed for sleep 02/16/21     ?triamcinolone (NASACORT) 55 MCG/ACT nasal inhaler Place 2 sprays into the nose daily. As needed for allergy symptom relief 05/25/18   Fulp, Cammie, MD  ?triamcinolone cream (KENALOG) 0.1 % apply twice daily as needed. 10/27/20     ?   ? ?Allergies    ?Patient has no known allergies.   ? ?Review of Systems   ?Review of Systems  ?All other systems reviewed and are negative. ? ?Physical Exam ?Updated Vital Signs ?BP (!) 165/109 (BP Location: Right Arm)   Pulse 85   Temp 98.2 ?F (36.8 ?C) (Oral)   Resp 18   SpO2 99%  ?Physical Exam ?Vitals and nursing note reviewed.  ?Constitutional:   ?   Appearance: Normal appearance.  ?HENT:  ?   Head: Normocephalic.  ?   Right Ear: External ear normal.  ?   Left Ear: External ear normal.  ?   Nose: Nose normal.  ?   Mouth/Throat:  ?   Pharynx: Oropharynx is clear.  ?Eyes:  ?   Pupils: Pupils are equal, round, and reactive to light.  ?Cardiovascular:  ?   Rate and Rhythm: Normal rate and regular rhythm.  ?   Pulses: Normal pulses.  ?Pulmonary:  ?   Effort: Pulmonary effort is normal.  ?   Breath sounds: Normal breath sounds.  ?   Comments: Some  upper airway stridor but patient able to not have stridor when asked to open mouth and minize sounds ?Abdominal:  ?   General: Abdomen is flat.  ?   Palpations: Abdomen is soft.  ?Musculoskeletal:     ?   Genera

## 2021-10-23 NOTE — ED Notes (Signed)
Patient verbalizes understanding of discharge instructions. Opportunity for questioning and answers were provided. Pt discharged from ED. 

## 2021-10-23 NOTE — ED Triage Notes (Signed)
Pt reports stuffy nose, chills, SOB, and abd pain x 1 week.  Denies fever.  Also reports difficulty urinating. ?

## 2021-10-23 NOTE — ED Provider Triage Note (Signed)
Emergency Medicine Provider Triage Evaluation Note ? ?Howard Rice , a 61 y.o. male  was evaluated in triage.  For the past week patient has been experiencing chills, congestion, rhinorrhea, difficulty breathing and back pain.  Says he has a history of hypertension however no other medical conditions ? ?Review of Systems  ?Positive: As above ?Negative: Syncope, palpitations ? ?Physical Exam  ?BP (!) 165/109 (BP Location: Right Arm)   Pulse 85   Temp 98.2 ?F (36.8 ?C) (Oral)   Resp 18   SpO2 99%  ?Gen:   Awake, no distress   ?Resp:  Normal effort  ?MSK:   Moves extremities without difficulty  ?Other:  RRR, lung sounds clear ? ?Medical Decision Making  ?Medically screening exam initiated at 9:02 AM.  Appropriate orders placed.  22 A Page was informed that the remainder of the evaluation will be completed by another provider, this initial triage assessment does not replace that evaluation, and the importance of remaining in the ED until their evaluation is complete. ? ? ?  ?Rhae Hammock, PA-C ?10/23/21 [id number] ? ?

## 2021-10-23 NOTE — Discharge Instructions (Signed)
You may use your Afrin 2 puffs in each nose 2 times a day for up to 3 days but no longer ?Please continue to use the albuterol 2 puffs for cough as needed every 4 hours ?Please continue acetaminophen 650 mg up to every 4 hours for body aches and flu ?Return if you are having worsening symptoms at any time ?Please continue to eat and drink due to medical necessity ?Follow-up with your primary care doctor next week. ?Return to the emergency department if you are having worsening symptoms at any time ?

## 2022-02-08 ENCOUNTER — Other Ambulatory Visit: Payer: Self-pay

## 2022-02-08 MED ORDER — AMLODIPINE BESYLATE 5 MG PO TABS
5.0000 mg | ORAL_TABLET | Freq: Every day | ORAL | 2 refills | Status: DC
  Filled 2022-02-08: qty 30, 30d supply, fill #0

## 2022-02-08 MED ORDER — IBUPROFEN 800 MG PO TABS
ORAL_TABLET | ORAL | 2 refills | Status: DC
  Filled 2022-02-08: qty 90, 30d supply, fill #0

## 2022-02-08 MED ORDER — CETIRIZINE HCL 10 MG PO TABS
ORAL_TABLET | ORAL | 2 refills | Status: DC
Start: 2022-02-08 — End: 2024-03-18
  Filled 2022-02-08: qty 30, 30d supply, fill #0

## 2022-02-08 MED ORDER — PANTOPRAZOLE SODIUM 40 MG PO TBEC
40.0000 mg | DELAYED_RELEASE_TABLET | Freq: Every day | ORAL | 2 refills | Status: DC
  Filled 2022-02-08: qty 30, 30d supply, fill #0

## 2022-02-08 MED ORDER — FLUTICASONE PROPIONATE 50 MCG/ACT NA SUSP
NASAL | 2 refills | Status: DC
  Filled 2022-02-08: qty 16, 30d supply, fill #0

## 2022-02-08 MED ORDER — ATORVASTATIN CALCIUM 20 MG PO TABS
ORAL_TABLET | ORAL | 2 refills | Status: DC
  Filled 2022-02-08: qty 30, 30d supply, fill #0

## 2022-02-09 ENCOUNTER — Other Ambulatory Visit: Payer: Self-pay

## 2022-02-09 MED ORDER — DOXYCYCLINE HYCLATE 100 MG PO TABS
100.0000 mg | ORAL_TABLET | Freq: Two times a day (BID) | ORAL | 0 refills | Status: DC
  Filled 2022-02-09: qty 20, 10d supply, fill #0

## 2022-02-10 ENCOUNTER — Ambulatory Visit (INDEPENDENT_AMBULATORY_CARE_PROVIDER_SITE_OTHER): Payer: Commercial Managed Care - HMO

## 2022-02-10 ENCOUNTER — Ambulatory Visit (HOSPITAL_COMMUNITY)
Admission: EM | Admit: 2022-02-10 | Discharge: 2022-02-10 | Disposition: A | Discharge status: Home / Self Care | Payer: Commercial Managed Care - HMO | Attending: Sports Medicine | Admitting: Sports Medicine

## 2022-02-10 ENCOUNTER — Encounter (HOSPITAL_COMMUNITY): Payer: Self-pay | Admitting: *Deleted

## 2022-02-10 DIAGNOSIS — G8929 Other chronic pain: Secondary | ICD-10-CM | POA: Diagnosis not present

## 2022-02-10 DIAGNOSIS — M25561 Pain in right knee: Secondary | ICD-10-CM | POA: Diagnosis not present

## 2022-02-10 NOTE — ED Triage Notes (Signed)
Patient has right knee pain and his PCP sent him to get an xray but the place they sent him doesn't take his insurance so he came here for the xray. He would like Xray sent to Dr. Concepcion Elk.

## 2022-02-10 NOTE — Discharge Instructions (Addendum)
Your x-rays showed no abnormality or fracture.  You may use ice and over-the-counter medications such as Tylenol or Voltaren Gel as needed for pain.  Follow-up with your primary care provider

## 2022-02-10 NOTE — ED Provider Notes (Signed)
[facility]    CSN: [id number] Arrival date & time: 02/10/22  1137      History   Chief Complaint Chief Complaint  Patient presents with   Knee Pain    HPI Howard Rice is a 61 y.o. male.   He presents today requesting imaging of his right knee.  He states his primary care provider sent him for x-rays however he was unable to afford them at the outpatient center so he presented here.  He states he has had knee pain for a couple of months now.  He had 1 incident where he hit to the inside of his knee while getting up in his pickup truck.  This pain is aggravated by standing on it for long periods of time such as a 12-hour work shift.  He denies any fevers, chills, nausea or vomiting.  Past Medical History:  Diagnosis Date   Allergic rhinitis    Chronic low back pain    Hypercholesteremia    Hypertension     Patient Active Problem List   Diagnosis Date Noted   Hypertension 05/25/2018   Chronic back pain 05/25/2018   Gross hematuria 05/25/2018   Elbow pain, chronic, right 05/25/2018   Hyperlipidemia 05/25/2018   Encounter for long-term (current) use of medications 05/25/2018   Nocturia 05/25/2018    History reviewed. No pertinent surgical history.     Home Medications    Prior to Admission medications   Medication Sig Start Date End Date Taking? Authorizing Provider  amLODipine (NORVASC) 5 MG tablet Take 1 tablet (5 mg total) by mouth daily. To lower blood pressure 08/01/19  Yes Fulp, Cammie, MD  atorvastatin (LIPITOR) 20 MG tablet Take 1 tablet (20 mg total) by mouth daily. In the evenings to lower cholesterol. 08/01/19  Yes Fulp, Cammie, MD  cetirizine (ZYRTEC ALLERGY) 10 MG tablet Take 1 tablet by mouth daily as needed. 02/16/21  Yes   cyclobenzaprine (FLEXERIL) 10 MG tablet Take 1 tablet (10 mg total) by mouth at bedtime. For muscle spasms. 08/01/19  Yes Fulp, Cammie, MD  fluticasone (FLONASE ALLERGY RELIEF) 50 MCG/ACT nasal spray use 2 sprays each nostril  once daily 02/16/21  Yes   gabapentin (NEURONTIN) 300 MG capsule take 1 capsule by mouth nightly at bedtime 10/27/20  Yes   amLODipine (NORVASC) 5 MG tablet TAKE 1 TABLET BY MOUTH ONCE DAILY 08/19/20 08/19/21  Fleet Contras, MD  amLODipine (NORVASC) 5 MG tablet TAKE 1 TABLET BY MOUTH ONCE A DAY 02/11/20 02/10/21  Fleet Contras, MD  amLODipine (NORVASC) 5 MG tablet take 1 tablet by mouth daily 10/27/20     amLODipine (NORVASC) 5 MG tablet Take 1 tablet by mouth once daily 02/16/21     amLODipine (NORVASC) 5 MG tablet Take 1 tablet (5 mg) by mouth daily. 02/08/22     amoxicillin (AMOXIL) 500 MG capsule Take 1 capsule (500 mg total) by mouth 3 (three) times daily. Patient not taking: Reported on 09/04/2018 04/06/18   Fulp, Hewitt Shorts, MD  atorvastatin (LIPITOR) 20 MG tablet TAKE 1 TABLET BY MOUTH AT BEDTIME 08/19/20 08/19/21  Fleet Contras, MD  atorvastatin (LIPITOR) 20 MG tablet Take 1 tablet by mouth at bedtime 02/16/21     atorvastatin (LIPITOR) 20 MG tablet Take 1 tablet (20mg ) by mouth a bedtime. 02/08/22     azithromycin (ZITHROMAX) 250 MG tablet take 2 tablets by mouth today, then 1 tablet by mouth once daily for 4 days. 10/27/20     cetirizine (ZYRTEC ALLERGY) 10  MG tablet Take 1 tablet (10mg ) by mouth daily as needed. 02/08/22     cetirizine (ZYRTEC) 10 MG tablet Take 1 tablet (10 mg total) by mouth daily. At bedtime for nasal congestion 08/01/19   Fulp, Cammie, MD  cetirizine (ZYRTEC) 10 MG tablet TAKE 1 TABLET BY MOUTH ONCE A DAY AS NEEDED 02/11/20 02/10/21  04/12/21, MD  cetirizine (ZYRTEC) 10 MG tablet take 1 tablet by mouth once daily as needed. 10/27/20     chlorpheniramine-HYDROcodone (TUSSIONEX PENNKINETIC ER) 10-8 MG/5ML LQCR Take 5 mLs by mouth every 12 (twelve) hours as needed. Patient not taking: Reported on 11/07/2018 06/18/11   14/8/12, PA-C  ciprofloxacin (CIPRO) 500 MG tablet Take 1 tablet by mouth every 12 hours for traveler's diarrhea 02/16/21     COVID-19 mRNA vaccine, Moderna, 100 MCG/0.5ML injection  Inject into the muscle. 02/18/21   04/20/21, MD  diclofenac sodium (VOLTAREN) 1 % GEL Apply 2 g up to 4 times daily to the elbow as needed for pain Patient not taking: Reported on 08/01/2019 05/25/18   Fulp, 05/27/18, MD  doxycycline (VIBRA-TABS) 100 MG tablet Take 1 tablet (100 mg total) by mouth 2 (two) times daily. 02/09/22     fluticasone (FLONASE ALLERGY RELIEF) 50 MCG/ACT nasal spray Use 2 sprays into each nostril once daily. 02/08/22     fluticasone (FLONASE) 50 MCG/ACT nasal spray Place 2 sprays into both nostrils daily. Patient not taking: Reported on 08/01/2019 11/07/18   11/09/18, MD  fluticasone (FLONASE) 50 MCG/ACT nasal spray USE 2 SPRAYS IN EACH NOSTRIL DAILY 02/11/20 02/10/21  04/12/21, MD  fluticasone (FLONASE) 50 MCG/ACT nasal spray use 2 sprays in each nostril once daily 10/27/20     gabapentin (NEURONTIN) 300 MG capsule Take 1 capsules at bedtime for nerve type pain/numbness in feet 08/01/19   Fulp, Cammie, MD  gabapentin (NEURONTIN) 300 MG capsule TAKE 1 CAPSULE BY MOUTH AT BEDTIME 08/19/20 08/19/21  10/17/21, MD  gabapentin (NEURONTIN) 300 MG capsule Take 1 tablet by mouth at bedtime 02/16/21     ibuprofen (ADVIL) 600 MG tablet Take 1 tablet (600 mg total) by mouth every 8 (eight) hours as needed. 08/01/19   Fulp, Cammie, MD  ibuprofen (ADVIL) 800 MG tablet Take 1 tablet (800 mg total) by mouth 3 (three) times daily as needed. 02/11/20   04/12/20, MD  ibuprofen (ADVIL) 800 MG tablet take 1 tab by mouth three times daily 10/27/20     ibuprofen (ADVIL) 800 MG tablet Take 1 tablet by mouth three times daily as needed 02/16/21     ibuprofen (ADVIL) 800 MG tablet Take 1 tablet (800 mg) by mouth 3 times daily as needed. 02/08/22     ketoconazole (NIZORAL) 2 % shampoo Apply 1 application topically 2 (two) times a week. Shampoo hair twice per week as needed 08/01/19   Fulp, Cammie, MD  ketoconazole (NIZORAL) 2 % shampoo Apply topically ot affected area twice a week. 02/08/21      loratadine (CLARITIN) 10 MG tablet Take 10 mg by mouth daily.      [provider]  mefloquine (LARIAM) 250 MG tablet Take 1 tablet weekly, take one week before travel to 4 weeks after return. 02/16/21     montelukast (SINGULAIR) 10 MG tablet Take 10 mg by mouth at bedtime.      [provider]  omeprazole (PRILOSEC) 20 MG capsule Take 1 capsule (20 mg total) by mouth daily. Must have office visit for refills 11/29/19  Fulp, Cammie, MD  oxyCODONE-acetaminophen (PERCOCET/ROXICET) 5-325 MG per tablet Take 1 tablet by mouth every 4 (four) hours as needed for pain. Patient not taking: Reported on 11/07/2018 06/20/12   Mancel Bale, MD  pantoprazole (PROTONIX) 40 MG tablet TAKE 1 TABLET BY MOUTH ONCE DAILY 08/19/20 08/19/21  Fleet Contras, MD  pantoprazole (PROTONIX) 40 MG tablet Take 1 tablet by mouth once daily 02/16/21     pantoprazole (PROTONIX) 40 MG tablet Take 1 tablet by mouth daily. 02/08/22     predniSONE (DELTASONE) 20 MG tablet Take 2 tablets by mouth daily for 3 days, then 1 tablet by mouth daily for 4 days 12/01/20     sildenafil (VIAGRA) 100 MG tablet TAKE 1 TABLET BY MOUTH ONCE DAILY AS NEEDED ONE HOUR PRIOR TO INTERCOURSE 08/19/20 08/19/21  Fleet Contras, MD  sildenafil (VIAGRA) 100 MG tablet Take 1 tablet by mouth as needed one hour prior to intercourse 02/16/21     tiZANidine (ZANAFLEX) 4 MG tablet Take 1 tablet (4mg ) by mouth twice daily as neeeded. 02/16/21     traMADol (ULTRAM) 50 MG tablet Take 1 tablet (50 mg total) by mouth 3 (three) times daily as needed for moderate pain. 30 day supply 08/01/19   Fulp, Cammie, MD  traMADol (ULTRAM) 50 MG tablet TAKE 1 TABLET BY MOUTH TWICE DAILY AS NEEDED FOR SEVERE PAINS. 10/27/20   10/29/20, MD  traMADol (ULTRAM) 50 MG tablet Take 1 tablet by mouth twice daily as needed for severe pains 12/22/20     traZODone (DESYREL) 100 MG tablet TAKE 1 TABLET BY MOUTH EVERY NIGHT AT BEDTIME AS NEEDED FOR SLEEP 02/11/20 02/10/21  04/12/21, MD   traZODone (DESYREL) 100 MG tablet take 1 tablet by mouth nightly at bedtime as needed for sleep 10/27/20     traZODone (DESYREL) 100 MG tablet Take 1 tablet by mouth nightly as needed for sleep 11/12/20     traZODone (DESYREL) 100 MG tablet Take 1 tablet at bedtime as needed for sleep 02/16/21     triamcinolone (NASACORT) 55 MCG/ACT nasal inhaler Place 2 sprays into the nose daily. As needed for allergy symptom relief 05/25/18   Fulp, Cammie, MD  triamcinolone cream (KENALOG) 0.1 % apply twice daily as needed. 10/27/20       Family History Family History  Problem Relation Age of Onset   Hypertension Mother    Hypertension Father     Social History Social History   Tobacco Use   Smoking status: Never   Smokeless tobacco: Never  Vaping Use   Vaping Use: Never used  Substance Use Topics   Alcohol use: No   Drug use: No     Allergies   Patient has no known allergies.   Review of Systems Review of Systems as listed above in HPI   Physical Exam Triage Vital Signs ED Triage Vitals  Enc Vitals Group     BP 02/10/22 1229 134/83     Pulse Rate 02/10/22 1229 88     Resp 02/10/22 1229 18     Temp 02/10/22 1229 98.2 F (36.8 C)     Temp Source 02/10/22 1229 Oral     SpO2 02/10/22 1229 97 %     Weight --      Height --      Head Circumference --      Peak Flow --      Pain Score 02/10/22 1222 9     Pain Loc --  Pain Edu? --      Excl. in GC? --    No data found.  Updated Vital Signs BP 134/83 (BP Location: Left Arm)   Pulse 88   Temp 98.2 F (36.8 C) (Oral)   Resp 18   SpO2 97%   Physical Exam Vitals reviewed.  Constitutional:      General: He is not in acute distress.    Appearance: Normal appearance. He is not ill-appearing, toxic-appearing or diaphoretic.  Cardiovascular:     Rate and Rhythm: Normal rate.  Musculoskeletal:     Comments: Right knee no obvious deformity or asymmetry.  No ecchymosis or effusion.  Tenderness to palpation along the medial  joint line.  Full range of motion 0 to 160 degrees flexion.  Stable to valgus and varus, however it does cause some discomfort.  Negative Lachman's.  Negative for crepitus.   Skin:    General: Skin is warm.  Neurological:     Mental Status: He is alert.     Gait: Gait normal.      UC Treatments / Results  Labs (all labs ordered are listed, but only abnormal results are displayed) Labs Reviewed - No data to display  EKG   Radiology DG Knee Complete 4 Views Right  Result Date: 02/10/2022 CLINICAL DATA:  Right knee pain. EXAM: RIGHT KNEE - COMPLETE 4+ VIEW COMPARISON:  None Available. FINDINGS: No evidence of fracture, dislocation, or joint effusion. No evidence of arthropathy or other focal bone abnormality. Soft tissues are unremarkable. IMPRESSION: Negative. Electronically Signed   By: Lupita Raider M.D.   On: 02/10/2022 13:49    Procedures Procedures (including critical care time)  Medications Ordered in UC Medications - No data to display  Initial Impression / Assessment and Plan / UC Course  I have reviewed the triage vital signs and the nursing notes.  Pertinent labs & imaging results that were available during my care of the patient were reviewed by me and considered in my medical decision making (see chart for details).     Presents today for right knee pain has been present for the past 2 months.  X-ray right knee, no obvious fracture, dislocation or joint effusion.  Soft tissues unremarkable, no other focal bone abnormality.  Recommend ice for acute pain and swelling after kneeling for prolonged periods of time, over-the-counter Tylenol, ibuprofen and Voltaren gel may be helpful to alleviate pain as well.  Follow-up with primary care provider. Final Clinical Impressions(s) / UC Diagnoses   Final diagnoses:  Chronic pain of right knee     Discharge Instructions      Your x-rays showed no abnormality or fracture.  You may use ice and over-the-counter medications  such as Tylenol or Voltaren Gel as needed for pain.  Follow-up with your primary care provider     ED Prescriptions   None    PDMP not reviewed this encounter.   Claudie Leach, MD 02/10/22 [id number]

## 2022-09-02 ENCOUNTER — Other Ambulatory Visit: Payer: Self-pay

## 2022-12-20 ENCOUNTER — Ambulatory Visit (AMBULATORY_SURGERY_CENTER): Payer: Medicaid Other

## 2022-12-20 VITALS — Ht 65.0 in | Wt 168.0 lb

## 2022-12-20 DIAGNOSIS — Z1211 Encounter for screening for malignant neoplasm of colon: Secondary | ICD-10-CM

## 2022-12-20 MED ORDER — NA SULFATE-K SULFATE-MG SULF 17.5-3.13-1.6 GM/177ML PO SOLN: 1.0000 | Freq: Once | ORAL | 0 refills | Status: AC

## 2022-12-20 NOTE — Progress Notes (Signed)
No egg or soy allergy known to patient  No issues known to pt with past sedation with any surgeries or procedures Patient denies ever being told they had issues or difficulty with intubation  No FH of Malignant Hyperthermia Pt is not on diet pills Pt is not on  home 02  Pt is not on blood thinners  Pt denies issues with constipation  No A fib or A flutter Have any cardiac testing pending--no Pt instructed to use [email or web address] or GoodRx for a price reduction on prep   

## 2022-12-26 ENCOUNTER — Ambulatory Visit: Payer: Medicaid Other | Admitting: Gastroenterology

## 2022-12-26 ENCOUNTER — Encounter: Payer: Self-pay | Admitting: Gastroenterology

## 2022-12-26 MED ORDER — NA SULFATE-K SULFATE-MG SULF 17.5-3.13-1.6 GM/177ML PO SOLN
1.0000 | Freq: Once | ORAL | 0 refills | Status: AC
Start: 2022-12-26 — End: 2022-12-26

## 2022-12-26 MED ORDER — SODIUM CHLORIDE 0.9 % IV SOLN: 500.0000 mL | Freq: Once | INTRAVENOUS | Status: DC

## 2022-12-26 NOTE — Progress Notes (Signed)
Patient cancelled due to not taking prep. Rescheduling at latter date.

## 2022-12-26 NOTE — Progress Notes (Unsigned)
Pt rescheduled to 01/25/23. Prep instructions printed and reviewed with pt. Sent new prescription for Suprep to pt's pharmacy.

## 2023-01-25 ENCOUNTER — Encounter: Payer: Self-pay | Admitting: Gastroenterology

## 2023-01-25 ENCOUNTER — Ambulatory Visit (AMBULATORY_SURGERY_CENTER): Payer: No Typology Code available for payment source | Admitting: Gastroenterology

## 2023-01-25 VITALS — BP 132/92 | HR 74 | Temp 98.3°F | Resp 17 | Ht 65.0 in | Wt 168.0 lb

## 2023-01-25 DIAGNOSIS — Z1211 Encounter for screening for malignant neoplasm of colon: Secondary | ICD-10-CM | POA: Diagnosis not present

## 2023-01-25 DIAGNOSIS — D123 Benign neoplasm of transverse colon: Secondary | ICD-10-CM

## 2023-01-25 MED ORDER — SODIUM CHLORIDE 0.9 % IV SOLN
500.0000 mL | INTRAVENOUS | Status: DC
Start: 2023-01-25 — End: 2023-01-25

## 2023-01-25 NOTE — Progress Notes (Signed)
 Called to room to assist during endoscopic procedure.  Patient ID and intended procedure confirmed with present staff. Received instructions for my participation in the procedure from the performing physician.  

## 2023-01-25 NOTE — Patient Instructions (Signed)
Discharge instructions given. Handouts on polyps,diverticulosis and hemorrhoids. Resume previous medications. YOU HAD AN ENDOSCOPIC PROCEDURE TODAY AT THE [facility]:   Refer to the procedure report that was given to you for any specific questions about what was found during the examination.  If the procedure report does not answer your questions, please call your gastroenterologist to clarify.  If you requested that your care partner not be given the details of your procedure findings, then the procedure report has been included in a sealed envelope for you to review at your convenience later.  YOU SHOULD EXPECT: Some feelings of bloating in the abdomen. Passage of more gas than usual.  Walking can help get rid of the air that was put into your GI tract during the procedure and reduce the bloating. If you had a lower endoscopy (such as a colonoscopy or flexible sigmoidoscopy) you may notice spotting of blood in your stool or on the toilet paper. If you underwent a bowel prep for your procedure, you may not have a normal bowel movement for a few days.  Please Note:  You might notice some irritation and congestion in your nose or some drainage.  This is from the oxygen used during your procedure.  There is no need for concern and it should clear up in a day or so.  SYMPTOMS TO REPORT IMMEDIATELY:  Following lower endoscopy (colonoscopy or flexible sigmoidoscopy):  Excessive amounts of blood in the stool  Significant tenderness or worsening of abdominal pains  Swelling of the abdomen that is new, acute  Fever of 100F or higher   For urgent or emergent issues, a gastroenterologist can be reached at any hour by calling [phone]. Do not use MyChart messaging for urgent concerns.    DIET:  We do recommend a small meal at first, but then you may proceed to your regular diet.  Drink plenty of fluids but you should avoid alcoholic beverages for 24 hours.  ACTIVITY:  You should  plan to take it easy for the rest of today and you should NOT DRIVE or use heavy machinery until tomorrow (because of the sedation medicines used during the test).    FOLLOW UP: Our staff will call the number listed on your records the next business day following your procedure.  We will call around 7:15- 8:00 am to check on you and address any questions or concerns that you may have regarding the information given to you following your procedure. If we do not reach you, we will leave a message.     If any biopsies were taken you will be contacted by phone or by letter within the next 1-3 weeks.  Please call us at [phone] if you have not heard about the biopsies in 3 weeks.    SIGNATURES/CONFIDENTIALITY: You and/or your care partner have signed paperwork which will be entered into your electronic medical record.  These signatures attest to the fact that that the information above on your After Visit Summary has been reviewed and is understood.  Full responsibility of the confidentiality of this discharge information lies with you and/or your care-partner. 

## 2023-01-25 NOTE — Progress Notes (Signed)
Patient states there have been no changes to medical or surgical history since time of pre-visit. 

## 2023-01-25 NOTE — Progress Notes (Signed)
[facility] History and Physical   Primary Care Physician:  Cain Saupe, MD   Reason for Procedure:   Colon cancer screening  Plan:    Screening colonoscopy     HPI: Howard Rice is a 62 y.o. male undergoing initial average risk screening colonoscopy.  He has no family history of colon cancer and no chronic GI symptoms.    Past Medical History:  Diagnosis Date   Allergic rhinitis    Chronic low back pain    Hypercholesteremia    Hypertension     History reviewed. No pertinent surgical history.  Prior to Admission medications   Medication Sig Start Date End Date Taking? Authorizing Provider  ketoconazole (NIZORAL) 2 % shampoo Apply 1 application topically 2 (two) times a week. Shampoo hair twice per week as needed 08/01/19  Yes Fulp, Cammie, MD  amLODipine (NORVASC) 5 MG tablet Take 1 tablet (5 mg total) by mouth daily. To lower blood pressure 08/01/19   Fulp, Cammie, MD  amLODipine (NORVASC) 5 MG tablet TAKE 1 TABLET BY MOUTH ONCE DAILY 08/19/20 08/19/21  Fleet Contras, MD  amLODipine (NORVASC) 5 MG tablet TAKE 1 TABLET BY MOUTH ONCE A DAY 02/11/20 02/10/21  Fleet Contras, MD  atorvastatin (LIPITOR) 20 MG tablet TAKE 1 TABLET BY MOUTH AT BEDTIME 08/19/20 08/19/21  Fleet Contras, MD  atorvastatin (LIPITOR) 20 MG tablet Take 1 tablet (20mg ) by mouth a bedtime. 02/08/22     cetirizine (ZYRTEC ALLERGY) 10 MG tablet Take 1 tablet (10mg ) by mouth daily as needed. 02/08/22     cetirizine (ZYRTEC) 10 MG tablet TAKE 1 TABLET BY MOUTH ONCE A DAY AS NEEDED 02/11/20 02/10/21  Fleet Contras, MD  cetirizine (ZYRTEC) 10 MG tablet take 1 tablet by mouth once daily as needed. 10/27/20     ciprofloxacin (CIPRO) 500 MG tablet Take 1 tablet by mouth every 12 hours for traveler's diarrhea Patient not taking: Reported on 12/20/2022 02/16/21     COVID-19 mRNA vaccine, Moderna, 100 MCG/0.5ML injection Inject into the muscle. Patient not taking: Reported on 12/20/2022 02/18/21   Judyann Munson, MD   cyclobenzaprine (FLEXERIL) 10 MG tablet Take 1 tablet (10 mg total) by mouth at bedtime. For muscle spasms. 08/01/19   Fulp, Cammie, MD  diclofenac sodium (VOLTAREN) 1 % GEL Apply 2 g up to 4 times daily to the elbow as needed for pain 05/25/18   Fulp, Cammie, MD  fluticasone (FLONASE ALLERGY RELIEF) 50 MCG/ACT nasal spray use 2 sprays each nostril once daily 02/16/21     fluticasone (FLONASE ALLERGY RELIEF) 50 MCG/ACT nasal spray Use 2 sprays into each nostril once daily. 02/08/22     fluticasone (FLONASE) 50 MCG/ACT nasal spray USE 2 SPRAYS IN EACH NOSTRIL DAILY 02/11/20 02/10/21  Fleet Contras, MD  gabapentin (NEURONTIN) 300 MG capsule Take 1 capsules at bedtime for nerve type pain/numbness in feet 08/01/19   Fulp, Cammie, MD  gabapentin (NEURONTIN) 300 MG capsule TAKE 1 CAPSULE BY MOUTH AT BEDTIME 08/19/20 08/19/21  Fleet Contras, MD  gabapentin (NEURONTIN) 300 MG capsule Take 1 tablet by mouth at bedtime 02/16/21     omeprazole (PRILOSEC) 20 MG capsule Take 1 capsule (20 mg total) by mouth daily. Must have office visit for refills 11/29/19   Fulp, Cammie, MD  pantoprazole (PROTONIX) 40 MG tablet TAKE 1 TABLET BY MOUTH ONCE DAILY 08/19/20 08/19/21  Fleet Contras, MD  sildenafil (VIAGRA) 100 MG tablet TAKE 1 TABLET BY MOUTH ONCE DAILY AS NEEDED ONE HOUR PRIOR TO INTERCOURSE 08/19/20  08/19/21  Fleet Contras, MD  tiZANidine (ZANAFLEX) 4 MG tablet Take 1 tablet (4mg ) by mouth twice daily as neeeded. 02/16/21     traZODone (DESYREL) 100 MG tablet TAKE 1 TABLET BY MOUTH EVERY NIGHT AT BEDTIME AS NEEDED FOR SLEEP 02/11/20 02/10/21  Fleet Contras, MD  triamcinolone (NASACORT) 55 MCG/ACT nasal inhaler Place 2 sprays into the nose daily. As needed for allergy symptom relief 05/25/18   Fulp, Hewitt Shorts, MD    Current Outpatient Medications  Medication Sig Dispense Refill   ketoconazole (NIZORAL) 2 % shampoo Apply 1 application topically 2 (two) times a week. Shampoo hair twice per week as needed 120 mL 11   amLODipine (NORVASC) 5  MG tablet Take 1 tablet (5 mg total) by mouth daily. To lower blood pressure 30 tablet 4   amLODipine (NORVASC) 5 MG tablet TAKE 1 TABLET BY MOUTH ONCE DAILY 90 tablet 2   amLODipine (NORVASC) 5 MG tablet TAKE 1 TABLET BY MOUTH ONCE A DAY 90 tablet 2   atorvastatin (LIPITOR) 20 MG tablet TAKE 1 TABLET BY MOUTH AT BEDTIME 90 tablet 2   atorvastatin (LIPITOR) 20 MG tablet Take 1 tablet (20mg ) by mouth a bedtime. 90 tablet 2   cetirizine (ZYRTEC ALLERGY) 10 MG tablet Take 1 tablet (10mg ) by mouth daily as needed. 90 tablet 2   cetirizine (ZYRTEC) 10 MG tablet TAKE 1 TABLET BY MOUTH ONCE A DAY AS NEEDED 90 tablet 2   cetirizine (ZYRTEC) 10 MG tablet take 1 tablet by mouth once daily as needed. 90 tablet 2   ciprofloxacin (CIPRO) 500 MG tablet Take 1 tablet by mouth every 12 hours for traveler's diarrhea (Patient not taking: Reported on 12/20/2022) 30 tablet 0   COVID-19 mRNA vaccine, Moderna, 100 MCG/0.5ML injection Inject into the muscle. (Patient not taking: Reported on 12/20/2022) 0.5 mL 0   cyclobenzaprine (FLEXERIL) 10 MG tablet Take 1 tablet (10 mg total) by mouth at bedtime. For muscle spasms. 90 tablet 3   diclofenac sodium (VOLTAREN) 1 % GEL Apply 2 g up to 4 times daily to the elbow as needed for pain 1 Tube 6   fluticasone (FLONASE ALLERGY RELIEF) 50 MCG/ACT nasal spray use 2 sprays each nostril once daily 16 g 2   fluticasone (FLONASE ALLERGY RELIEF) 50 MCG/ACT nasal spray Use 2 sprays into each nostril once daily. 16 g 2   fluticasone (FLONASE) 50 MCG/ACT nasal spray USE 2 SPRAYS IN EACH NOSTRIL DAILY 16 g 5   gabapentin (NEURONTIN) 300 MG capsule Take 1 capsules at bedtime for nerve type pain/numbness in feet 90 capsule 3   gabapentin (NEURONTIN) 300 MG capsule TAKE 1 CAPSULE BY MOUTH AT BEDTIME 90 capsule 2   gabapentin (NEURONTIN) 300 MG capsule Take 1 tablet by mouth at bedtime 90 capsule 2   omeprazole (PRILOSEC) 20 MG capsule Take 1 capsule (20 mg total) by mouth daily. Must have  office visit for refills 30 capsule 0   pantoprazole (PROTONIX) 40 MG tablet TAKE 1 TABLET BY MOUTH ONCE DAILY 30 tablet 2   sildenafil (VIAGRA) 100 MG tablet TAKE 1 TABLET BY MOUTH ONCE DAILY AS NEEDED ONE HOUR PRIOR TO INTERCOURSE 30 tablet 5   tiZANidine (ZANAFLEX) 4 MG tablet Take 1 tablet (4mg ) by mouth twice daily as neeeded. 180 tablet 2   traZODone (DESYREL) 100 MG tablet TAKE 1 TABLET BY MOUTH EVERY NIGHT AT BEDTIME AS NEEDED FOR SLEEP 30 tablet 2   triamcinolone (NASACORT) 55 MCG/ACT nasal inhaler Place 2 sprays into  the nose daily. As needed for allergy symptom relief 1 Inhaler 11   Current Facility-Administered Medications  Medication Dose Route Frequency Provider Last Rate Last Admin   0.9 %  sodium chloride infusion  500 mL Intravenous Continuous Jenel Lucks, MD        Allergies as of 01/25/2023   (Not on File)    Family History  Problem Relation Age of Onset   Hypertension Mother    Hypertension Father    Colon cancer Neg Hx    Colon polyps Neg Hx    Esophageal cancer Neg Hx    Rectal cancer Neg Hx    Stomach cancer Neg Hx     Social History   Socioeconomic History   Marital status: Married    Spouse name: Not on file   Number of children: Not on file   Years of education: Not on file   Highest education level: Not on file  Occupational History   Not on file  Tobacco Use   Smoking status: Never   Smokeless tobacco: Never  Vaping Use   Vaping status: Never Used  Substance and Sexual Activity   Alcohol use: No   Drug use: No   Sexual activity: Not on file  Other Topics Concern   Not on file  Social History Narrative   Not on file   Social Determinants of Health   Financial Resource Strain: Not on file  Food Insecurity: Not on file  Transportation Needs: Not on file  Physical Activity: Not on file  Stress: Not on file  Social Connections: Unknown (11/19/2021)   Received from [facility]   Social Network    Social Network: Not on file   Intimate Partner Violence: Unknown (10/11/2021)   Received from [facility]   HITS    Physically Hurt: Not on file    Insult or Talk Down To: Not on file    Threaten Physical Harm: Not on file    Scream or Curse: Not on file    Review of Systems:  All other review of systems negative except as mentioned in the HPI.  Physical Exam: Vital signs BP (!) 162/77   Pulse 84   Temp 98.3 F (36.8 C) (Skin)   Ht 5\' 5"  (1.651 m)   Wt 168 lb (76.2 kg)   SpO2 97%   BMI 27.96 kg/m   General:   Alert,  Well-developed, well-nourished, pleasant and cooperative in NAD Airway:  Mallampati 1 Lungs:  Clear throughout to auscultation.   Heart:  Regular rate and rhythm; no murmurs, clicks, rubs,  or gallops. Abdomen:  Soft, nontender and nondistended. Normal bowel sounds.   Neuro/Psych:  Normal mood and affect. A and O x 3   Diann Bangerter E. Tomasa Rand, MD [facility]

## 2023-01-25 NOTE — Op Note (Signed)
[facility] Patient Name: Howard Rice Procedure Date: 01/25/2023 2:47 PM MRN: [id number] Endoscopist: Lorin Picket E. Tomasa Rand , MD, [id number] Age: 62 Referring MD:  Date of Birth: 1960-11-02 Gender: Male Account #: [id number] Procedure:                Colonoscopy Indications:              Screening for colorectal malignant neoplasm, This                            is the patient's first colonoscopy Medicines:                Monitored Anesthesia Care Procedure:                Pre-Anesthesia Assessment:                           - Prior to the procedure, a History and Physical                            was performed, and patient medications and                            allergies were reviewed. The patient's tolerance of                            previous anesthesia was also reviewed. The risks                            and benefits of the procedure and the sedation                            options and risks were discussed with the patient.                            All questions were answered, and informed consent                            was obtained. Prior Anticoagulants: The patient has                            taken no anticoagulant or antiplatelet agents. ASA                            Grade Assessment: II - A patient with mild systemic                            disease. After reviewing the risks and benefits,                            the patient was deemed in satisfactory condition to                            undergo the procedure.  After obtaining informed consent, the colonoscope                            was passed under direct vision. Throughout the                            procedure, the patient's blood pressure, pulse, and                            oxygen saturations were monitored continuously. The                            CF [id number] #[id number] was introduced through the anus                            and advanced to the  the terminal ileum, with                            identification of the appendiceal orifice and IC                            valve. The colonoscopy was performed without                            difficulty. The patient tolerated the procedure                            well. The quality of the bowel preparation was                            good. The terminal ileum, ileocecal valve,                            appendiceal orifice, and rectum were photographed.                            The bowel preparation used was SUPREP via split                            dose instruction. Scope In: 2:58:15 PM Scope Out: 3:16:21 PM Scope Withdrawal Time: 0 hours 15 minutes 19 seconds  Total Procedure Duration: 0 hours 18 minutes 6 seconds  Findings:                 The perianal and digital rectal examinations were                            normal. Pertinent negatives include normal                            sphincter tone and no palpable rectal lesions.                           A 3 mm polyp was found in the transverse colon. The  polyp was sessile. The polyp was removed with a                            cold snare. Resection and retrieval were complete.                            Estimated blood loss was minimal.                           A 4 mm polyp was found in the splenic flexure. The                            polyp was sessile. The polyp was removed with a                            cold snare but there appeared to be some residual                            polyp which was then removed with forceps.                            Resection and retrieval were complete. Estimated                            blood loss was minimal.                           Multiple medium-mouthed and small-mouthed                            diverticula were found in the sigmoid colon,                            descending colon, ascending colon and cecum.                            The exam was otherwise normal throughout the                            examined colon.                           The terminal ileum appeared normal.                           Non-bleeding internal hemorrhoids were found during                            retroflexion. The hemorrhoids were Grade I                            (internal hemorrhoids that do not prolapse).                           No additional abnormalities  were found on                            retroflexion. Complications:            No immediate complications. Estimated Blood Loss:     Estimated blood loss was minimal. Impression:               - One 3 mm polyp in the transverse colon, removed                            with a cold snare. Resected and retrieved.                           - One 4 mm polyp at the splenic flexure, removed                            with a cold snare. Resected and retrieved.                           - Moderate diverticulosis in the sigmoid colon, in                            the descending colon, in the ascending colon and in                            the cecum.                           - The examined portion of the ileum was normal.                           - Non-bleeding internal hemorrhoids. Recommendation:           - Patient has a contact number available for                            emergencies. The signs and symptoms of potential                            delayed complications were discussed with the                            patient. Return to normal activities tomorrow.                            Written discharge instructions were provided to the                            patient.                           - Resume previous diet.                           - Continue present medications.                           -  Await pathology results.                           - Repeat colonoscopy (date not yet determined) for                            surveillance based on pathology  results. Shirlean Berman E. Tomasa Rand, MD 01/25/2023 3:22:43 PM This report has been signed electronically.

## 2023-01-25 NOTE — Progress Notes (Signed)
Uneventful propofol anesthetic. Report to pacu rn. Vss. Care resumed by rn.

## 2023-01-26 ENCOUNTER — Telehealth: Payer: Self-pay | Admitting: *Deleted

## 2023-01-26 NOTE — Telephone Encounter (Signed)
Post procedure follow up call placed, no answer and no VM setup.

## 2023-02-01 NOTE — Progress Notes (Signed)
Mr. Howard Rice,  The two polyps which I removed during your recent procedure were proven to be completely benign but are considered "pre-cancerous" polyps that MAY have grown into cancer if they had not been removed.  Studies shows that at least 20% of women over age 64 and 30% of men over age 59 have pre-cancerous polyps.  Based on current nationally recognized surveillance guidelines, I recommend that you have a repeat colonoscopy in 7 years.   If you develop any new rectal bleeding, abdominal pain or significant bowel habit changes, please contact me before then.

## 2023-06-12 ENCOUNTER — Other Ambulatory Visit (HOSPITAL_COMMUNITY): Payer: Self-pay

## 2023-08-25 ENCOUNTER — Encounter (HOSPITAL_COMMUNITY): Payer: Self-pay

## 2023-08-25 ENCOUNTER — Ambulatory Visit (HOSPITAL_COMMUNITY)
Admission: EM | Admit: 2023-08-25 | Discharge: 2023-08-25 | Disposition: A | Discharge status: Home / Self Care | Payer: Medicare Other | Attending: Family Medicine | Admitting: Family Medicine

## 2023-08-25 VITALS — BP 133/84 | HR 97 | Temp 98.2°F | Resp 18 | Ht 65.0 in

## 2023-08-25 DIAGNOSIS — G4700 Insomnia, unspecified: Secondary | ICD-10-CM | POA: Insufficient documentation

## 2023-08-25 DIAGNOSIS — G8929 Other chronic pain: Secondary | ICD-10-CM | POA: Diagnosis not present

## 2023-08-25 DIAGNOSIS — R3912 Poor urinary stream: Secondary | ICD-10-CM | POA: Diagnosis not present

## 2023-08-25 DIAGNOSIS — M545 Low back pain, unspecified: Secondary | ICD-10-CM | POA: Insufficient documentation

## 2023-08-25 LAB — POCT URINALYSIS DIP (MANUAL ENTRY)
Bilirubin, UA: NEGATIVE
Glucose, UA: NEGATIVE mg/dL
Ketones, POC UA: NEGATIVE mg/dL
Leukocytes, UA: NEGATIVE
Nitrite, UA: NEGATIVE
Protein Ur, POC: 100 mg/dL — AB
Spec Grav, UA: 1.025 (ref 1.010–1.025)
Urobilinogen, UA: 0.2 U/dL
pH, UA: 5.5 (ref 5.0–8.0)

## 2023-08-25 MED ORDER — LIDOCAINE 4 % EX PTCH: 1.0000 | MEDICATED_PATCH | CUTANEOUS | 0 refills | Status: DC

## 2023-08-25 MED ORDER — TAMSULOSIN HCL 0.4 MG PO CAPS: 0.4000 mg | ORAL_CAPSULE | Freq: Every day | ORAL | 0 refills | Status: DC

## 2023-08-25 NOTE — Discharge Instructions (Addendum)
You can use the Flomax nightly after supper to help with your urinary stream.  Please follow-up with your urologist for further investigation to see if you had benign prostate hyperplasia, or any other internal issues.  For your chronic back pain you can trial the lidocaine patches.  Please follow-up with your primary care provider for any ongoing issues.  Blood pressure was slightly elevated today in clinic, please follow-up with your primary care provider regarding this issue.  Regarding your insomnia, you can trial over-the-counter melatonin.  Follow-up with your primary care provider regarding this ongoing issue.  s Urgent Care providers, we only only can evaluate you for an episodic event; and this cannot be substituted for the continued care and monitoring by your primary care provider and/or specialist. It is not unusual that a medical condition can present itself in one way, then progress or change and lead to another impression of your medical condition. If you should have any new or worsening symptoms, please go to the closest emergency department and contact your primary physician as soon as possible for further evaluation and testing. Return to clinic for new or urgent symptoms.    Below are some local dental resources.  [facility] [place] Address: [address] [place], [place], [place] [address] Phone: [phone]  [facility] [phone] extension [phone] [address]  Dr. Lawrence Marseilles [phone] [address]  [place] [phone] [address] [facility] [place].  Rescue mission [phone] extension [phone], [place], [place], [address] First come first serve for the first 10 clients.  May do simple extractions only, no wisdom teeth or surgery.  You may try the second for Thursday of the month starting at 6:30 AM.  [facility] You may call the school to see if they are still helping to provide dental  care for emergent cases.

## 2023-08-25 NOTE — ED Triage Notes (Signed)
Chief Complaint: Body aches, fatigue, headache/sinus pain that keeps him up at night. Patient having urinary frequency with lower abdominal pain. Blood pressure has been elevated as well.   Sick exposure: No  Onset: 1 week ago  Prescriptions or OTC medications tried: Yes- Ibuprofen 800 mg, muscle relaxer, Zyrtec    with no relief  New foods, medications, or products: No  Recent Travel: No
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
## 2023-08-27 ENCOUNTER — Encounter (HOSPITAL_COMMUNITY): Payer: Self-pay | Admitting: Emergency Medicine

## 2023-08-27 LAB — URINE CULTURE: Culture: <10000 — AB

## 2023-09-08 ENCOUNTER — Ambulatory Visit (INDEPENDENT_AMBULATORY_CARE_PROVIDER_SITE_OTHER): Payer: Medicare Other | Admitting: Family

## 2023-09-08 ENCOUNTER — Encounter: Payer: Self-pay | Admitting: Family

## 2023-09-08 VITALS — BP 138/84 | HR 80 | Temp 98.1°F | Resp 19 | Ht 66.0 in | Wt 172.6 lb

## 2023-09-08 DIAGNOSIS — R252 Cramp and spasm: Secondary | ICD-10-CM

## 2023-09-08 DIAGNOSIS — K219 Gastro-esophageal reflux disease without esophagitis: Secondary | ICD-10-CM

## 2023-09-08 DIAGNOSIS — K2100 Gastro-esophageal reflux disease with esophagitis, without bleeding: Secondary | ICD-10-CM | POA: Insufficient documentation

## 2023-09-08 DIAGNOSIS — Z1159 Encounter for screening for other viral diseases: Secondary | ICD-10-CM

## 2023-09-08 DIAGNOSIS — N400 Enlarged prostate without lower urinary tract symptoms: Secondary | ICD-10-CM | POA: Diagnosis not present

## 2023-09-08 DIAGNOSIS — Z7689 Persons encountering health services in other specified circumstances: Secondary | ICD-10-CM

## 2023-09-08 DIAGNOSIS — J309 Allergic rhinitis, unspecified: Secondary | ICD-10-CM

## 2023-09-08 DIAGNOSIS — I10 Essential (primary) hypertension: Secondary | ICD-10-CM

## 2023-09-08 DIAGNOSIS — M545 Low back pain, unspecified: Secondary | ICD-10-CM

## 2023-09-08 DIAGNOSIS — Z113 Encounter for screening for infections with a predominantly sexual mode of transmission: Secondary | ICD-10-CM

## 2023-09-08 MED ORDER — ATORVASTATIN CALCIUM 20 MG PO TABS: ORAL_TABLET | ORAL | 2 refills | Status: DC

## 2023-09-08 MED ORDER — FLUTICASONE PROPIONATE 50 MCG/ACT NA SUSP: NASAL | 2 refills | Status: AC

## 2023-09-08 MED ORDER — OMEPRAZOLE 20 MG PO CPDR: 20.0000 mg | DELAYED_RELEASE_CAPSULE | Freq: Every day | ORAL | 1 refills | Status: DC

## 2023-09-08 MED ORDER — IBUPROFEN 800 MG PO TABS: 800.0000 mg | ORAL_TABLET | Freq: Three times a day (TID) | ORAL | 3 refills | Status: AC | PRN

## 2023-09-08 MED ORDER — AMLODIPINE BESYLATE 5 MG PO TABS: 5.0000 mg | ORAL_TABLET | Freq: Every day | ORAL | 4 refills | Status: DC

## 2023-09-08 MED ORDER — GABAPENTIN 300 MG PO CAPS
ORAL_CAPSULE | ORAL | 2 refills | Status: DC
Start: 2023-09-08 — End: 2023-09-08

## 2023-09-08 MED ORDER — TAMSULOSIN HCL 0.4 MG PO CAPS: 0.4000 mg | ORAL_CAPSULE | Freq: Every day | ORAL | 1 refills | Status: DC

## 2023-09-08 MED ORDER — GABAPENTIN 300 MG PO CAPS: ORAL_CAPSULE | ORAL | 2 refills | Status: AC

## 2023-09-08 MED ORDER — CYCLOBENZAPRINE HCL 10 MG PO TABS: 10.0000 mg | ORAL_TABLET | Freq: Every day | ORAL | 3 refills | Status: DC

## 2023-09-09 LAB — CBC WITH DIFFERENTIAL/PLATELET
Absolute Lymphocytes: 2166 {cells}/uL (ref 850–3900)
Absolute Monocytes: 547 {cells}/uL (ref 200–950)
Basophils Absolute: 61 {cells}/uL (ref 0–200)
Basophils Relative: 0.8 %
Eosinophils Absolute: 68 {cells}/uL (ref 15–500)
Eosinophils Relative: 0.9 %
HCT: 46.9 % (ref 38.5–50.0)
Hemoglobin: 15.6 g/dL (ref 13.2–17.1)
MCH: 29.9 pg (ref 27.0–33.0)
MCHC: 33.3 g/dL (ref 32.0–36.0)
MCV: 90 fL (ref 80.0–100.0)
MPV: 11 fL (ref 7.5–12.5)
Monocytes Relative: 7.2 %
Neutro Abs: 4758 {cells}/uL (ref 1500–7800)
Neutrophils Relative %: 62.6 %
Platelets: 209 Thousand/uL (ref 140–400)
RBC: 5.21 Million/uL (ref 4.20–5.80)
RDW: 13 % (ref 11.0–15.0)
Total Lymphocyte: 28.5 %
WBC: 7.6 Thousand/uL (ref 3.8–10.8)

## 2023-09-09 LAB — LIPID PANEL
Cholesterol: 297 mg/dL — ABNORMAL HIGH (ref <200)
HDL: 52 mg/dL (ref >=40)
LDL Cholesterol (Calc): 210 mg/dL — ABNORMAL HIGH
Non-HDL Cholesterol (Calc): 245 mg/dL — ABNORMAL HIGH (ref <130)
Total CHOL/HDL Ratio: 5.7 (calc) — ABNORMAL HIGH (ref <5.0)
Triglycerides: 176 mg/dL — ABNORMAL HIGH (ref <150)

## 2023-09-09 LAB — COMPLETE METABOLIC PANEL WITH GFR
AG Ratio: 1.6 (calc) (ref 1.0–2.5)
ALT: 13 U/L (ref 9–46)
AST: 16 U/L (ref 10–35)
Albumin: 4.6 g/dL (ref 3.6–5.1)
Alkaline phosphatase (APISO): 82 U/L (ref 35–144)
BUN: 18 mg/dL (ref 7–25)
CO2: 29 mmol/L (ref 20–32)
Calcium: 9.8 mg/dL (ref 8.6–10.3)
Chloride: 104 mmol/L (ref 98–110)
Creat: 1.05 mg/dL (ref 0.70–1.35)
Globulin: 2.9 g/dL (ref 1.9–3.7)
Glucose, Bld: 85 mg/dL (ref 65–99)
Potassium: 4.5 mmol/L (ref 3.5–5.3)
Sodium: 141 mmol/L (ref 135–146)
Total Bilirubin: 1.1 mg/dL (ref 0.2–1.2)
Total Protein: 7.5 g/dL (ref 6.1–8.1)
eGFR: 79 mL/min/{1.73_m2} (ref >=60)

## 2023-09-09 LAB — TSH: TSH: 1.88 m[IU]/L (ref 0.40–4.50)

## 2023-09-09 LAB — HEPATITIS C ANTIBODY: Hepatitis C Ab: NONREACTIVE

## 2023-09-11 ENCOUNTER — Telehealth: Payer: Self-pay | Admitting: *Deleted

## 2023-09-11 DIAGNOSIS — K2100 Gastro-esophageal reflux disease with esophagitis, without bleeding: Secondary | ICD-10-CM

## 2023-09-11 MED ORDER — OMEPRAZOLE 40 MG PO CPDR: 40.0000 mg | DELAYED_RELEASE_CAPSULE | Freq: Every day | ORAL | 0 refills | Status: DC

## 2023-09-11 NOTE — Telephone Encounter (Signed)
 Patient walked into the office with his Omeprazole Bottle of 20mg  once daily and stated that it should be Omeprazole 40mg .  Patient stated that Omeprazole 20 mg does not help him and he needs the 40mg . Stated that this was discussed at the appointment.   Patient requesting a Rx for Omeprazole 40mg  to be sent to his Pharmacy, [facility] today.   Please Advise.

## 2023-09-11 NOTE — Telephone Encounter (Signed)
 will change Omeprazole from 20 mg to 40 mg  as requested.

## 2023-09-15 ENCOUNTER — Ambulatory Visit: Payer: Self-pay | Admitting: Family

## 2023-09-17 DIAGNOSIS — N400 Enlarged prostate without lower urinary tract symptoms: Secondary | ICD-10-CM | POA: Insufficient documentation
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
## 2024-03-07 ENCOUNTER — Ambulatory Visit: Payer: Medicare Other | Admitting: Family

## 2024-03-08 ENCOUNTER — Ambulatory Visit: Payer: Medicare Other | Admitting: Family

## 2024-03-18 ENCOUNTER — Encounter (HOSPITAL_COMMUNITY): Payer: Self-pay

## 2024-03-18 ENCOUNTER — Ambulatory Visit (HOSPITAL_COMMUNITY)
Admission: EM | Admit: 2024-03-18 | Discharge: 2024-03-18 | Disposition: A | Discharge status: Home / Self Care | Attending: Physician Assistant | Admitting: Physician Assistant

## 2024-03-18 DIAGNOSIS — M545 Low back pain, unspecified: Secondary | ICD-10-CM | POA: Diagnosis not present

## 2024-03-18 DIAGNOSIS — I10 Essential (primary) hypertension: Secondary | ICD-10-CM | POA: Diagnosis not present

## 2024-03-18 DIAGNOSIS — R3911 Hesitancy of micturition: Secondary | ICD-10-CM

## 2024-03-18 DIAGNOSIS — R0981 Nasal congestion: Secondary | ICD-10-CM | POA: Diagnosis not present

## 2024-03-18 DIAGNOSIS — K2100 Gastro-esophageal reflux disease with esophagitis, without bleeding: Secondary | ICD-10-CM

## 2024-03-18 MED ORDER — CETIRIZINE HCL 10 MG PO TABS: ORAL_TABLET | ORAL | 0 refills | Status: AC

## 2024-03-18 MED ORDER — NYSTATIN 100000 UNIT/ML MT SUSP: OROMUCOSAL | 0 refills | Status: AC

## 2024-03-18 MED ORDER — AMLODIPINE BESYLATE 5 MG PO TABS: 5.0000 mg | ORAL_TABLET | Freq: Every day | ORAL | 0 refills | Status: AC

## 2024-03-18 MED ORDER — TAMSULOSIN HCL 0.4 MG PO CAPS: 0.4000 mg | ORAL_CAPSULE | Freq: Every day | ORAL | 0 refills | Status: AC

## 2024-03-18 MED ORDER — OMEPRAZOLE 40 MG PO CPDR: 40.0000 mg | DELAYED_RELEASE_CAPSULE | Freq: Every day | ORAL | 0 refills | Status: AC

## 2024-03-18 MED ORDER — CYCLOBENZAPRINE HCL 10 MG PO TABS: 10.0000 mg | ORAL_TABLET | Freq: Every day | ORAL | 0 refills | Status: AC

## 2024-03-18 MED ORDER — ATORVASTATIN CALCIUM 20 MG PO TABS: ORAL_TABLET | ORAL | 0 refills | Status: AC

## 2024-03-18 NOTE — ED Triage Notes (Signed)
 Patient here today with c/o nasal congestion this morning. Denies cough. Patient uses Zyrtec  and Flonase  for allergies. No known sick contacts.   Patient also c/o some bumps on the side of his tongue X 4-5 days which make it difficult to eat.   Patient also c/o difficulty urinating and low abd pain X 8 months.

## 2024-03-18 NOTE — ED Provider Notes (Signed)
 [facility]    CSN: [id number] Arrival date & time: 03/18/24  1014      History   Chief Complaint Chief Complaint  Patient presents with   Nasal Congestion   Urinary Retention    HPI Howard Rice is a 63 y.o. male.   Patient complains of multiple concerns.  Patient states he is out of his blood pressure medicines his cholesterol medicines and his sleep medicines.  Patient also reports he is having difficulty urinating.  Patient has a history of BPH and is supposed to be taking Flomax  but he reports he has ran out of this medication.  Patient states he also has some sores on the side of his tongue that he is concerned about.  Patient denies any fever he has not had any chills.  Patient tells me he has not been able to see his primary care physician.  The history is provided by the patient. No language interpreter was used.    Past Medical History:  Diagnosis Date   Allergic rhinitis    Chronic low back pain    Hypercholesteremia    Hypertension     Patient Active Problem List   Diagnosis Date Noted   Benign prostatic hyperplasia without lower urinary tract symptoms 09/17/2023   Gastroesophageal reflux disease with esophagitis without hemorrhage 09/08/2023   Essential hypertension 05/25/2018   Chronic back pain 05/25/2018   Gross hematuria 05/25/2018   Elbow pain, chronic, right 05/25/2018   Hyperlipidemia 05/25/2018   Encounter for long-term (current) use of medications 05/25/2018   Nocturia 05/25/2018    History reviewed. No pertinent surgical history.     Home Medications    Prior to Admission medications   Medication Sig Start Date End Date Taking? Authorizing Provider  magic mouthwash (nystatin , lidocaine , diphenhydrAMINE ) suspension Swish in mouth for 2 minutes and then spit out four times a day 03/18/24  Yes Flint, Davanta Meuser K, PA-C  amLODipine  (NORVASC ) 5 MG tablet Take 1 tablet (5 mg total) by mouth daily. To lower blood pressure 03/18/24   Deshana Rominger,  Yonna Alwin K, PA-C  atorvastatin  (LIPITOR) 20 MG tablet Take 1 tablet (20mg ) by mouth a bedtime. 03/18/24   Beyza Bellino K, PA-C  cetirizine  (ZYRTEC  ALLERGY) 10 MG tablet Take 1 tablet (10mg ) by mouth daily as needed. 03/18/24   Flint Sonny POUR, PA-C  cyclobenzaprine  (FLEXERIL ) 10 MG tablet Take 1 tablet (10 mg total) by mouth at bedtime. For muscle spasms. 03/18/24   Pecola Haxton K, PA-C  diclofenac  sodium (VOLTAREN ) 1 % GEL Apply 2 g up to 4 times daily to the elbow as needed for pain 05/25/18   Fulp, Cammie, MD  fluticasone  (FLONASE  ALLERGY RELIEF) 50 MCG/ACT nasal spray use 2 sprays each nostril once daily 09/08/23   Ngetich, Dinah C, NP  gabapentin  (NEURONTIN ) 300 MG capsule Take 1 tablet by mouth at bedtime 09/08/23   Ngetich, Dinah C, NP  ibuprofen  (ADVIL ) 800 MG tablet Take 1 tablet (800 mg total) by mouth 3 (three) times daily as needed. 09/08/23   Ngetich, Dinah C, NP  ketoconazole  (NIZORAL ) 2 % shampoo Apply 1 application topically 2 (two) times a week. Shampoo hair twice per week as needed 08/01/19   Fulp, Cammie, MD  omeprazole  (PRILOSEC) 40 MG capsule Take 1 capsule (40 mg total) by mouth daily. 03/18/24   Flint Sonny POUR, PA-C  tamsulosin  (FLOMAX ) 0.4 MG CAPS capsule Take 1 capsule (0.4 mg total) by mouth daily after supper. 03/18/24   Katja Blue K,  PA-C    Family History Family History  Problem Relation Age of Onset   Hypertension Mother    Hypertension Father    Colon cancer Neg Hx    Colon polyps Neg Hx    Esophageal cancer Neg Hx    Rectal cancer Neg Hx    Stomach cancer Neg Hx     Social History Social History   Tobacco Use   Smoking status: Never   Smokeless tobacco: Never  Vaping Use   Vaping status: Never Used  Substance Use Topics   Alcohol use: No   Drug use: No     Allergies   Patient has no known allergies.   Review of Systems Review of Systems  HENT:  Positive for mouth sores.   All other systems reviewed and are negative.    Physical Exam Triage  Vital Signs ED Triage Vitals  Encounter Vitals Group     BP 03/18/24 1031 (!) 154/94     Girls Systolic BP Percentile --      Girls Diastolic BP Percentile --      Boys Systolic BP Percentile --      Boys Diastolic BP Percentile --      Pulse Rate 03/18/24 1031 88     Resp 03/18/24 1031 16     Temp 03/18/24 1031 98.1 F (36.7 C)     Temp Source 03/18/24 1031 Oral     SpO2 03/18/24 1031 96 %     Weight 03/18/24 1035 175 lb (79.4 kg)     Height --      Head Circumference --      Peak Flow --      Pain Score 03/18/24 1034 8     Pain Loc --      Pain Education --      Exclude from Growth Chart --    No data found.  Updated Vital Signs BP (!) 154/94 (BP Location: Left Arm)   Pulse 88   Temp 98.1 F (36.7 C) (Oral)   Resp 16   Wt 79.4 kg   SpO2 96%   BMI 28.25 kg/m   Visual Acuity Right Eye Distance:   Left Eye Distance:   Bilateral Distance:    Right Eye Near:   Left Eye Near:    Bilateral Near:     Physical Exam Vitals and nursing note reviewed.  Constitutional:      Appearance: He is well-developed.  HENT:     Head: Normocephalic.     Nose: Nose normal.     Mouth/Throat:     Comments: Tongue slightly erythematous, no obvious lesions Cardiovascular:     Rate and Rhythm: Normal rate.  Pulmonary:     Effort: Pulmonary effort is normal.  Abdominal:     General: There is no distension.  Musculoskeletal:        General: Normal range of motion.     Cervical back: Normal range of motion.  Skin:    General: Skin is warm.  Neurological:     General: No focal deficit present.     Mental Status: He is alert and oriented to person, place, and time.      UC Treatments / Results  Labs (all labs ordered are listed, but only abnormal results are displayed) Labs Reviewed - No data to display  EKG   Radiology No results found.  Procedures Procedures (including critical care time)  Medications Ordered in UC Medications - No data to display  Initial  Impression / Assessment and Plan / UC Course  I have reviewed the triage vital signs and the nursing notes.  Pertinent labs & imaging results that were available during my care of the patient were reviewed by me and considered in my medical decision making (see chart for details).     Patient is given a prescription for his regular medications.  He is given a prescription for [facility] Magic mouthwash to help with mouth irritation.  I emphasized to patient the importance of scheduling to see his primary care physician for renewal of his medications.  Patient is discharged in stable condition Final Clinical Impressions(s) / UC Diagnoses   Final diagnoses:  Nasal congestion  Urinary hesitancy   Discharge Instructions   None    ED Prescriptions     Medication Sig Dispense Auth. Provider   amLODipine  (NORVASC ) 5 MG tablet Take 1 tablet (5 mg total) by mouth daily. To lower blood pressure 30 tablet Moishe Schellenberg K, PA-C   atorvastatin  (LIPITOR) 20 MG tablet Take 1 tablet (20mg ) by mouth a bedtime. 30 tablet Mariesa Grieder K, PA-C   cetirizine  (ZYRTEC  ALLERGY) 10 MG tablet Take 1 tablet (10mg ) by mouth daily as needed. 30 tablet Machai Desmith K, PA-C   cyclobenzaprine  (FLEXERIL ) 10 MG tablet Take 1 tablet (10 mg total) by mouth at bedtime. For muscle spasms. 30 tablet Waldo Damian K, PA-C   omeprazole  (PRILOSEC) 40 MG capsule Take 1 capsule (40 mg total) by mouth daily. 30 capsule Marcene Laskowski K, PA-C   tamsulosin  (FLOMAX ) 0.4 MG CAPS capsule Take 1 capsule (0.4 mg total) by mouth daily after supper. 30 capsule Alene Bergerson K, PA-C   magic mouthwash (nystatin , lidocaine , diphenhydrAMINE ) suspension Swish in mouth for 2 minutes and then spit out four times a day 180 mL Flint Sonny POUR, PA-C      PDMP not reviewed this encounter. An After Visit Summary was printed and given to the patient.       Flint Sonny POUR, PA-C 03/18/24 1408

## 2024-03-19 ENCOUNTER — Other Ambulatory Visit (HOSPITAL_COMMUNITY): Payer: Self-pay
